# Patient Record
Sex: Female | Born: 1960 | Race: White | Hispanic: No | Smoking: Never smoker
Health system: Southern US, Community
[De-identification: ages and names within clinical notes are randomized; demographics above are authoritative.]

## PROBLEM LIST (undated history)

## (undated) DIAGNOSIS — F32A Depression, unspecified: Secondary | ICD-10-CM

## (undated) DIAGNOSIS — M1990 Unspecified osteoarthritis, unspecified site: Secondary | ICD-10-CM

## (undated) DIAGNOSIS — H269 Unspecified cataract: Secondary | ICD-10-CM

## (undated) DIAGNOSIS — T7840XA Allergy, unspecified, initial encounter: Secondary | ICD-10-CM

## (undated) DIAGNOSIS — F329 Major depressive disorder, single episode, unspecified: Secondary | ICD-10-CM

## (undated) DIAGNOSIS — R002 Palpitations: Secondary | ICD-10-CM

## (undated) HISTORY — DX: Depression, unspecified: F32.A

## (undated) HISTORY — PX: CHOLECYSTECTOMY: SHX55

## (undated) HISTORY — DX: Major depressive disorder, single episode, unspecified: F32.9

## (undated) HISTORY — PX: APPENDECTOMY: SHX54

## (undated) HISTORY — DX: Unspecified cataract: H26.9

## (undated) HISTORY — DX: Unspecified osteoarthritis, unspecified site: M19.90

## (undated) HISTORY — PX: LUMBAR SPINE SURGERY: SHX701

## (undated) HISTORY — DX: Palpitations: R00.2

## (undated) HISTORY — PX: ANKLE SURGERY: SHX546

## (undated) HISTORY — DX: Allergy, unspecified, initial encounter: T78.40XA

---

## 2004-05-25 ENCOUNTER — Ambulatory Visit: Payer: Self-pay | Admitting: Internal Medicine

## 2004-06-07 ENCOUNTER — Ambulatory Visit: Payer: Self-pay | Admitting: Internal Medicine

## 2004-10-03 ENCOUNTER — Ambulatory Visit: Payer: Self-pay | Admitting: Internal Medicine

## 2005-02-10 ENCOUNTER — Ambulatory Visit: Payer: Self-pay | Admitting: Internal Medicine

## 2005-05-23 ENCOUNTER — Ambulatory Visit: Payer: Self-pay | Admitting: Internal Medicine

## 2006-05-02 ENCOUNTER — Ambulatory Visit: Payer: Self-pay | Admitting: Internal Medicine

## 2006-05-04 ENCOUNTER — Ambulatory Visit (HOSPITAL_COMMUNITY): Admission: RE | Admit: 2006-05-04 | Discharge: 2006-05-04 | Payer: Self-pay | Admitting: Internal Medicine

## 2006-05-09 ENCOUNTER — Ambulatory Visit: Payer: Self-pay | Admitting: Internal Medicine

## 2006-05-10 ENCOUNTER — Encounter: Admission: RE | Admit: 2006-05-10 | Discharge: 2006-05-10 | Payer: Self-pay | Admitting: Internal Medicine

## 2006-05-16 ENCOUNTER — Ambulatory Visit: Payer: Self-pay | Admitting: Internal Medicine

## 2006-05-22 HISTORY — PX: ABDOMINAL HYSTERECTOMY: SHX81

## 2006-05-25 ENCOUNTER — Ambulatory Visit: Payer: Self-pay | Admitting: Internal Medicine

## 2006-06-14 ENCOUNTER — Encounter: Admission: RE | Admit: 2006-06-14 | Discharge: 2006-06-14 | Payer: Self-pay | Admitting: Orthopedic Surgery

## 2006-06-29 ENCOUNTER — Ambulatory Visit: Payer: Self-pay | Admitting: Internal Medicine

## 2006-07-25 ENCOUNTER — Encounter: Admission: RE | Admit: 2006-07-25 | Discharge: 2006-07-25 | Payer: Self-pay | Admitting: Neurology

## 2006-08-28 ENCOUNTER — Encounter: Admission: RE | Admit: 2006-08-28 | Discharge: 2006-08-28 | Payer: Self-pay | Admitting: Obstetrics and Gynecology

## 2006-09-19 ENCOUNTER — Encounter (INDEPENDENT_AMBULATORY_CARE_PROVIDER_SITE_OTHER): Payer: Self-pay | Admitting: Specialist

## 2006-09-19 ENCOUNTER — Ambulatory Visit (HOSPITAL_COMMUNITY): Admission: RE | Admit: 2006-09-19 | Discharge: 2006-09-20 | Payer: Self-pay | Admitting: *Deleted

## 2006-10-27 ENCOUNTER — Encounter: Admission: RE | Admit: 2006-10-27 | Discharge: 2006-10-27 | Payer: Self-pay | Admitting: Neurosurgery

## 2006-11-30 ENCOUNTER — Inpatient Hospital Stay (HOSPITAL_COMMUNITY): Admission: RE | Admit: 2006-11-30 | Discharge: 2006-12-04 | Payer: Self-pay | Admitting: Neurosurgery

## 2006-12-15 DIAGNOSIS — J309 Allergic rhinitis, unspecified: Secondary | ICD-10-CM | POA: Insufficient documentation

## 2006-12-15 DIAGNOSIS — J45909 Unspecified asthma, uncomplicated: Secondary | ICD-10-CM

## 2006-12-15 DIAGNOSIS — IMO0001 Reserved for inherently not codable concepts without codable children: Secondary | ICD-10-CM

## 2007-02-04 ENCOUNTER — Ambulatory Visit: Payer: Self-pay | Admitting: Internal Medicine

## 2007-02-20 ENCOUNTER — Ambulatory Visit: Payer: Self-pay | Admitting: Internal Medicine

## 2007-02-20 LAB — CONVERTED CEMR LAB
ALT: 23 units/L (ref 0–35)
AST: 25 units/L (ref 0–37)
Albumin: 4.5 g/dL (ref 3.5–5.2)
Amylase: 104 units/L (ref 27–131)
BUN: 6 mg/dL (ref 6–23)
Bilirubin Urine: NEGATIVE
Calcium: 9.6 mg/dL (ref 8.4–10.5)
Chloride: 108 meq/L (ref 96–112)
GFR calc non Af Amer: 115 mL/min
Glucose, Bld: 103 mg/dL — ABNORMAL HIGH (ref 70–99)
Ketones, ur: NEGATIVE mg/dL
RBC / HPF: NONE SEEN
Sodium: 145 meq/L (ref 135–145)
Specific Gravity, Urine: <=1.005 (ref 1.000–1.03)
Total Bilirubin: 0.8 mg/dL (ref 0.3–1.2)
Urobilinogen, UA: 0.2 (ref 0.0–1.0)

## 2007-02-22 ENCOUNTER — Encounter: Payer: Self-pay | Admitting: Internal Medicine

## 2007-03-02 ENCOUNTER — Encounter: Payer: Self-pay | Admitting: Internal Medicine

## 2007-04-26 ENCOUNTER — Ambulatory Visit: Payer: Self-pay | Admitting: Internal Medicine

## 2007-04-30 ENCOUNTER — Telehealth: Payer: Self-pay | Admitting: Internal Medicine

## 2007-05-01 ENCOUNTER — Ambulatory Visit: Payer: Self-pay | Admitting: Internal Medicine

## 2007-05-02 ENCOUNTER — Ambulatory Visit: Payer: Self-pay | Admitting: Internal Medicine

## 2007-05-09 ENCOUNTER — Telehealth: Payer: Self-pay | Admitting: Internal Medicine

## 2007-05-09 ENCOUNTER — Encounter: Payer: Self-pay | Admitting: Internal Medicine

## 2007-07-31 ENCOUNTER — Ambulatory Visit: Payer: Self-pay | Admitting: Internal Medicine

## 2007-10-01 ENCOUNTER — Encounter: Admission: RE | Admit: 2007-10-01 | Discharge: 2007-10-01 | Payer: Self-pay | Admitting: Internal Medicine

## 2007-10-24 ENCOUNTER — Ambulatory Visit: Payer: Self-pay | Admitting: Internal Medicine

## 2007-10-24 DIAGNOSIS — F4321 Adjustment disorder with depressed mood: Secondary | ICD-10-CM

## 2007-10-24 DIAGNOSIS — K1120 Sialoadenitis, unspecified: Secondary | ICD-10-CM

## 2007-11-12 ENCOUNTER — Ambulatory Visit: Payer: Self-pay | Admitting: Licensed Clinical Social Worker

## 2007-11-18 ENCOUNTER — Ambulatory Visit: Payer: Self-pay | Admitting: Licensed Clinical Social Worker

## 2007-11-25 ENCOUNTER — Ambulatory Visit: Payer: Self-pay | Admitting: Licensed Clinical Social Worker

## 2007-11-27 ENCOUNTER — Ambulatory Visit: Payer: Self-pay | Admitting: Internal Medicine

## 2007-11-27 ENCOUNTER — Telehealth: Payer: Self-pay | Admitting: Internal Medicine

## 2007-11-27 DIAGNOSIS — Z9189 Other specified personal risk factors, not elsewhere classified: Secondary | ICD-10-CM | POA: Insufficient documentation

## 2007-11-29 ENCOUNTER — Telehealth: Payer: Self-pay | Admitting: Internal Medicine

## 2007-12-02 ENCOUNTER — Ambulatory Visit: Payer: Self-pay | Admitting: Licensed Clinical Social Worker

## 2008-01-10 ENCOUNTER — Telehealth (INDEPENDENT_AMBULATORY_CARE_PROVIDER_SITE_OTHER): Payer: Self-pay | Admitting: *Deleted

## 2008-02-04 ENCOUNTER — Ambulatory Visit: Payer: Self-pay | Admitting: Internal Medicine

## 2008-02-04 LAB — CONVERTED CEMR LAB
FSH: 21.8 m[IU]/mL
LH: 9.9 m[IU]/mL

## 2008-02-06 ENCOUNTER — Encounter: Payer: Self-pay | Admitting: Internal Medicine

## 2008-04-09 IMAGING — CR DG LUMBAR SPINE COMPLETE 4+V
5 series · 5 of 5 positions shown · non-contrast
Comparison: none

CLINICAL DATA: Low back pain.  Pain radiates down right leg to knee.  Occasional popping in low back. 
 LUMBAR SPINE - 5 VIEW:

[t l-spine a.p.]
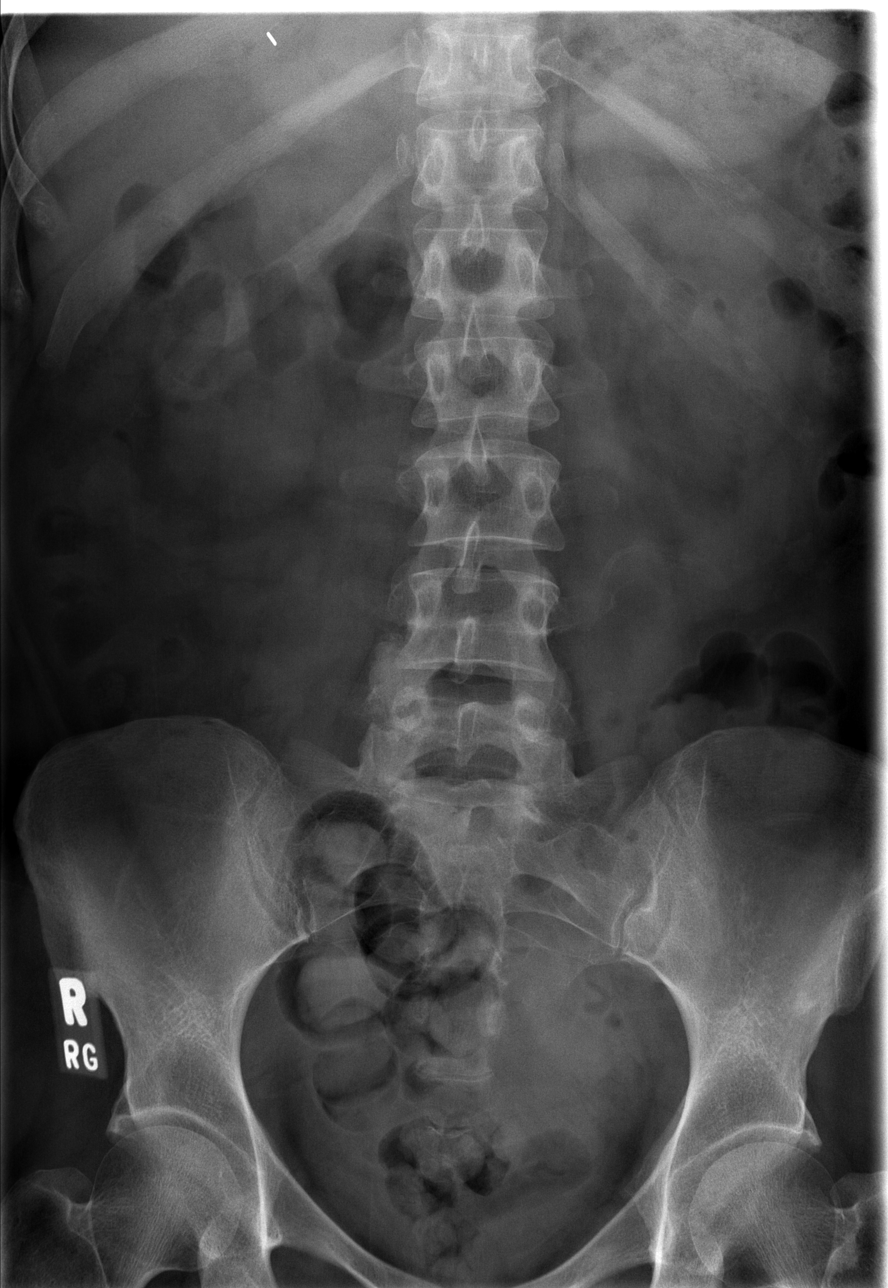

[t l-spine oblique exposure (1 of 2)]
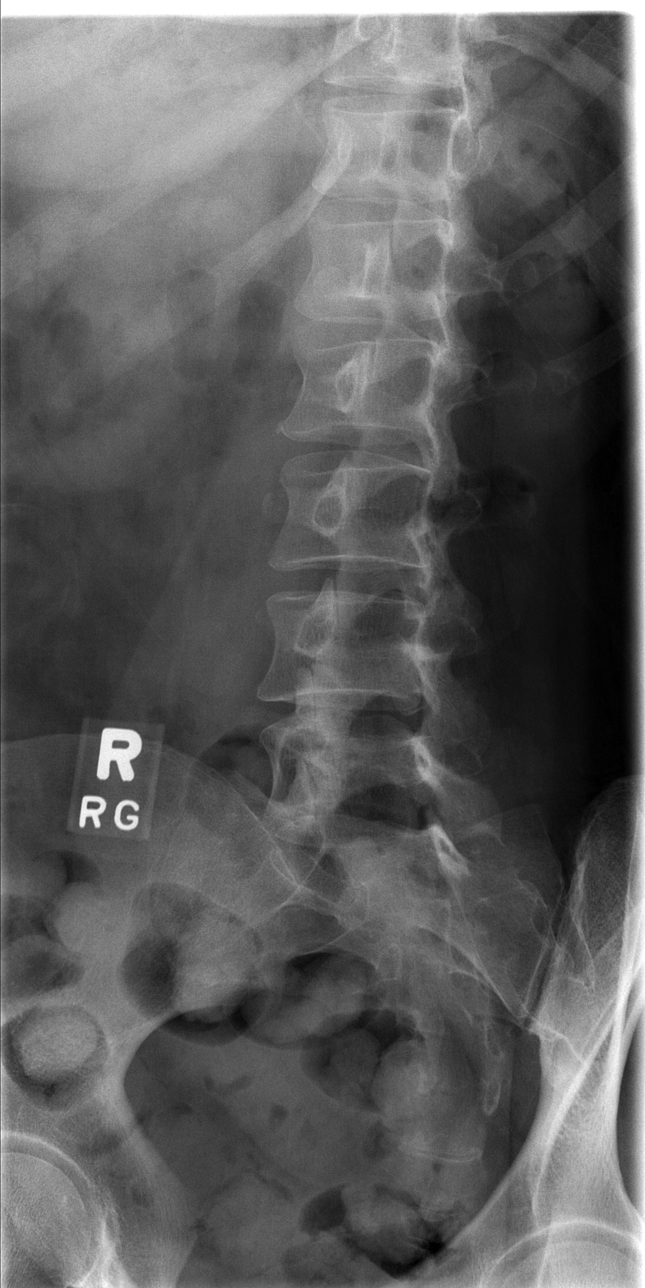

[t l-spine oblique exposure (2 of 2)]
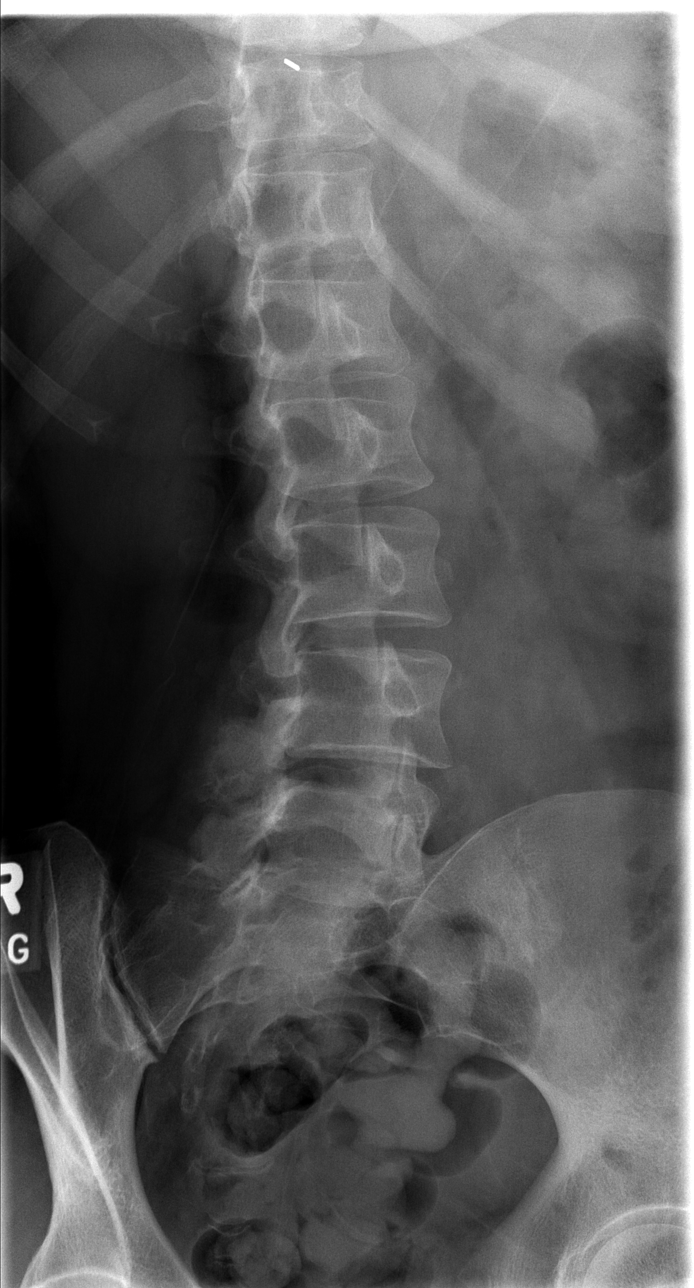

[t l-spine lat]
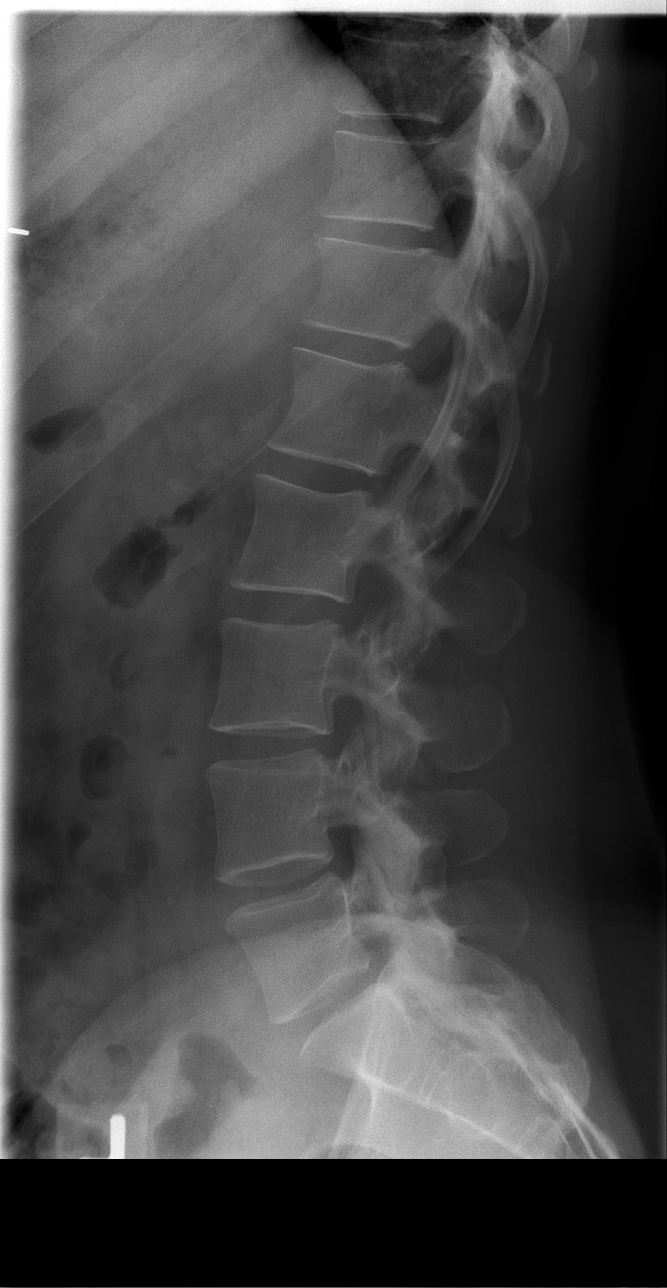

[t l-spine l5-s1 spot]
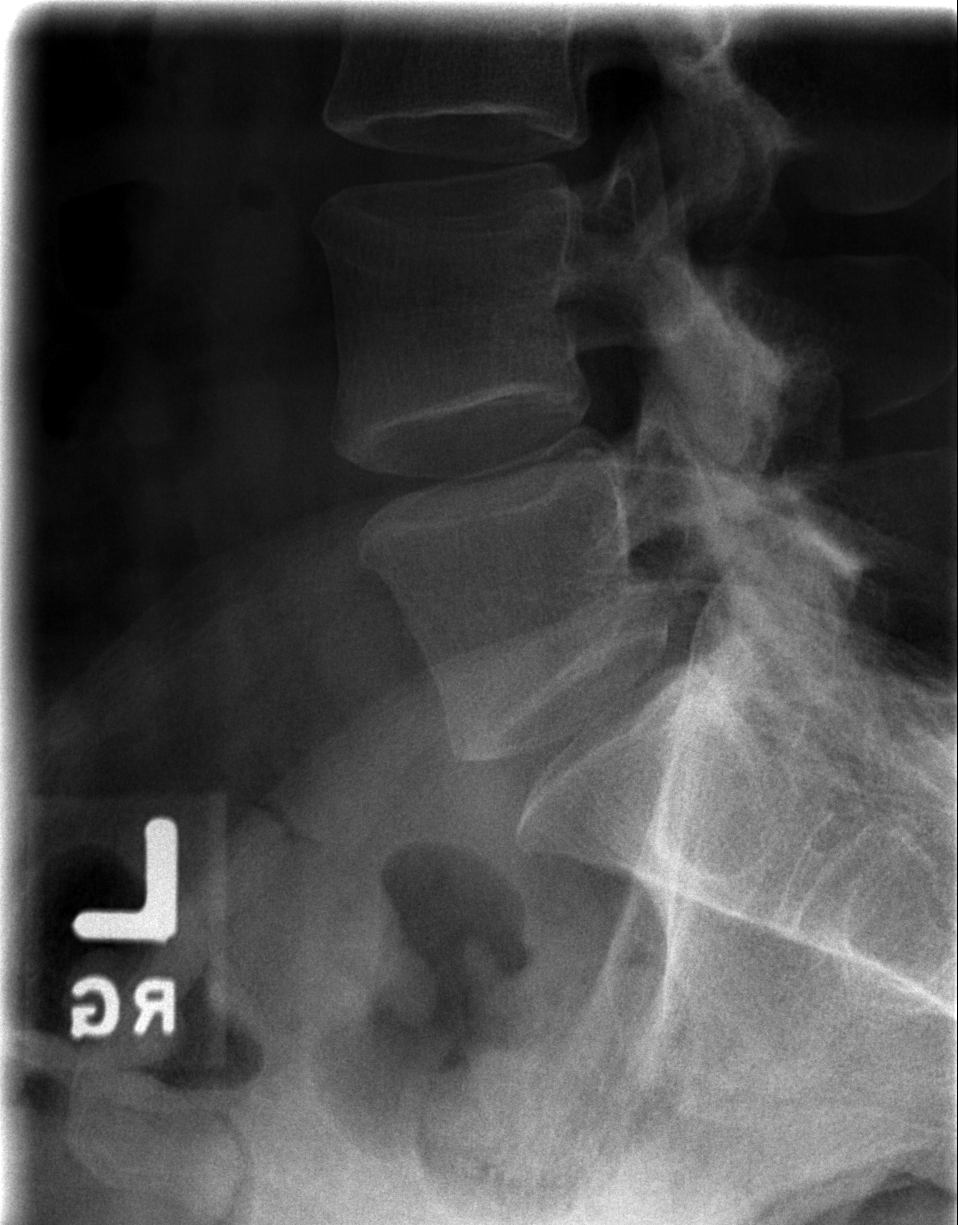

[5 of 5 positions shown; findings below may reference images not displayed]

FINDINGS: There is no evidence of lumbar spine fracture.  Alignment is normal.  Intervertebral disc spaces are maintained, and no other significant bone abnormalities are identified.  Lower lumbar degenerative facet joint changes, mainly on the right at L4-5.  SI joints unremarkable.
IMPRESSION: No acute lumbar spine abnormality.  Degenerative facet arthropathic changes on the right at L4-5.

## 2008-07-24 ENCOUNTER — Ambulatory Visit: Payer: Self-pay | Admitting: Internal Medicine

## 2008-07-28 ENCOUNTER — Telehealth (INDEPENDENT_AMBULATORY_CARE_PROVIDER_SITE_OTHER): Payer: Self-pay | Admitting: *Deleted

## 2008-07-29 ENCOUNTER — Encounter: Payer: Self-pay | Admitting: Internal Medicine

## 2008-08-05 ENCOUNTER — Telehealth: Payer: Self-pay | Admitting: Internal Medicine

## 2008-08-12 ENCOUNTER — Telehealth (INDEPENDENT_AMBULATORY_CARE_PROVIDER_SITE_OTHER): Payer: Self-pay | Admitting: *Deleted

## 2008-09-22 ENCOUNTER — Ambulatory Visit: Payer: Self-pay | Admitting: Internal Medicine

## 2008-10-15 ENCOUNTER — Ambulatory Visit: Payer: Self-pay | Admitting: Internal Medicine

## 2008-10-26 ENCOUNTER — Telehealth: Payer: Self-pay | Admitting: Endocrinology

## 2008-10-30 ENCOUNTER — Telehealth: Payer: Self-pay | Admitting: Endocrinology

## 2008-10-30 ENCOUNTER — Ambulatory Visit: Payer: Self-pay | Admitting: Endocrinology

## 2008-11-05 ENCOUNTER — Encounter: Payer: Self-pay | Admitting: Internal Medicine

## 2009-01-18 ENCOUNTER — Ambulatory Visit: Payer: Self-pay | Admitting: Internal Medicine

## 2009-04-09 ENCOUNTER — Ambulatory Visit: Payer: Self-pay | Admitting: Internal Medicine

## 2009-04-09 ENCOUNTER — Encounter (INDEPENDENT_AMBULATORY_CARE_PROVIDER_SITE_OTHER): Payer: Self-pay | Admitting: *Deleted

## 2009-06-04 ENCOUNTER — Ambulatory Visit: Payer: Self-pay | Admitting: Internal Medicine

## 2009-06-04 DIAGNOSIS — J069 Acute upper respiratory infection, unspecified: Secondary | ICD-10-CM | POA: Insufficient documentation

## 2009-06-15 ENCOUNTER — Telehealth: Payer: Self-pay | Admitting: Internal Medicine

## 2009-07-15 ENCOUNTER — Ambulatory Visit: Payer: Self-pay | Admitting: Internal Medicine

## 2009-08-03 ENCOUNTER — Telehealth: Payer: Self-pay | Admitting: Internal Medicine

## 2009-08-12 ENCOUNTER — Ambulatory Visit: Payer: Self-pay | Admitting: Internal Medicine

## 2009-08-12 LAB — CONVERTED CEMR LAB
Basophils Absolute: 0.1 10*3/uL (ref 0.0–0.1)
Calcium: 9.1 mg/dL (ref 8.4–10.5)
Eosinophils Absolute: 0.6 10*3/uL (ref 0.0–0.7)
Eosinophils Relative: 7.1 % — ABNORMAL HIGH (ref 0.0–5.0)
GFR calc non Af Amer: 94.76 mL/min (ref >60)
HCT: 38 % (ref 36.0–46.0)
Hemoglobin: 13.1 g/dL (ref 12.0–15.0)
Lymphs Abs: 2.5 10*3/uL (ref 0.7–4.0)
MCV: 87 fL (ref 78.0–100.0)
Monocytes Absolute: 1.2 10*3/uL — ABNORMAL HIGH (ref 0.1–1.0)
Monocytes Relative: 13.5 % — ABNORMAL HIGH (ref 3.0–12.0)
Platelets: 240 10*3/uL (ref 150.0–400.0)
Potassium: 4 meq/L (ref 3.5–5.1)
Sodium: 142 meq/L (ref 135–145)
TSH: 2.36 microintl units/mL (ref 0.35–5.50)
Vitamin B-12: 601 pg/mL (ref 211–911)
WBC: 8.7 10*3/uL (ref 4.5–10.5)

## 2009-08-19 ENCOUNTER — Ambulatory Visit: Payer: Self-pay | Admitting: Internal Medicine

## 2009-10-28 ENCOUNTER — Ambulatory Visit: Payer: Self-pay | Admitting: Internal Medicine

## 2009-11-08 ENCOUNTER — Encounter (INDEPENDENT_AMBULATORY_CARE_PROVIDER_SITE_OTHER): Payer: Self-pay | Admitting: *Deleted

## 2009-11-08 ENCOUNTER — Ambulatory Visit: Payer: Self-pay | Admitting: Internal Medicine

## 2009-11-19 ENCOUNTER — Telehealth: Payer: Self-pay | Admitting: Internal Medicine

## 2010-01-14 ENCOUNTER — Ambulatory Visit: Payer: Self-pay | Admitting: Internal Medicine

## 2010-02-16 ENCOUNTER — Telehealth: Payer: Self-pay | Admitting: Internal Medicine

## 2010-03-17 ENCOUNTER — Ambulatory Visit: Payer: Self-pay | Admitting: Internal Medicine

## 2010-03-17 DIAGNOSIS — R002 Palpitations: Secondary | ICD-10-CM

## 2010-03-17 DIAGNOSIS — M2550 Pain in unspecified joint: Secondary | ICD-10-CM

## 2010-03-17 HISTORY — DX: Palpitations: R00.2

## 2010-05-04 ENCOUNTER — Telehealth: Payer: Self-pay | Admitting: Internal Medicine

## 2010-06-13 ENCOUNTER — Encounter: Payer: Self-pay | Admitting: Internal Medicine

## 2010-06-21 NOTE — Letter (Signed)
Summary: Out of Work  [facility]  [address] [place], [place] [address]   Phone: [phone]  Fax: [phone]    November 08, 2009   Employee:  CYNITHIA HAKIMI    To Whom It May Concern:   For Medical reasons, please excuse the above named employee from work for the following dates:  Start: 11/08/09    End: 11/10/09, May return back to work on Wednesaday    If you need additional information, please feel free to contact our office.         Sincerely,    Dr. Rene Paci

## 2010-06-21 NOTE — Progress Notes (Signed)
Summary: OV & LABS  Phone Note Call from Patient Call back at Home Phone [phone] Call back at [facility] [phone]   Summary of Call: Patient is requesting office visit next week with labs prior. Pt c/o fatigue, trouble sleeping, decreased appetite x 3 wks and would like "everything" checked.  Initial call taken by: Lamar Sprinkles, CMA,  August 03, 2009 2:56 PM  Follow-up for Phone Call        1. OK for OV  2. Lab 780.79: CBCD, Metabolic, TSH, B12. She needs to be seen within 48 horus of having lab.  Follow-up by: Jacques Navy MD,  August 03, 2009 4:14 PM  Additional Follow-up for Phone Call Additional follow up Details #1::        Patient notified and transferred to schedule. Additional Follow-up by: Lucious Groves,  August 04, 2009 9:38 AM

## 2010-06-21 NOTE — Assessment & Plan Note (Signed)
Summary: PER KELLY--STC   Vital Signs:  Patient profile:   50 year old female Height:      63 inches (160.02 cm) Weight:      174.25 pounds BMI:     30.98 O2 Sat:      95 % on Room air Temp:     98.0 degrees F oral Pulse rate:   80 / minute BP sitting:   114 / 82  (left arm) Cuff size:   regular  Vitals Entered By: Brenton Grills (August 19, 2009 10:59 AM)  O2 Flow:  Room air CC: pt here for f/u after labs. Pt states she is fatigued, decreased appetite, trouble sleeping x 3 weeks/aj   Primary Care Provider:  Jacques Navy MD  CC:  pt here for f/u after labs. Pt states she is fatigued, decreased appetite, and trouble sleeping x 3 weeks/aj.  History of Present Illness: Patient well known to the practice. She had called complaining of excessive fatigue and malaise without specific complaint. She has had no fever/chils, weight changes, chest pain, breathing difficulty or other specific physical complaints. She did have a battery of lab prior to this visit.   Current Medications (verified): 1)  Proair Hfa 108 (90 Base) Mcg/act Aers (Albuterol Sulfate) .... 2 Puffs Four Times Per Day As Needed 2)  Sertraline Hcl 100 Mg  Tabs (Sertraline Hcl) .Marland Kitchen.. 1 By Mouth Once Daily 3)  Singulair 10 Mg Tabs (Montelukast Sodium) .Marland Kitchen.. 1 By Mouth Once Daily 4)  Nasacort Aq 55 Mcg/act Aers (Triamcinolone Acetonide(Nasal)) .Marland Kitchen.. 1 Spray Each Nostril Daily 5)  Vicodin 5-500 Mg Tabs (Hydrocodone-Acetaminophen) .Marland Kitchen.. 1 Q4h As Needed Pain  Allergies (verified): No Known Drug Allergies  Past History:  Past Medical History: Last updated: 04/26/2007 Allergic rhinitis Asthma Depression FMS  Past Surgical History: Last updated: 04/26/2007 Lumbar laminectomy - 7/08 Hysterectomy - 4/08 right ankle surg 1998 with plate and screw Appendectomy Cholecystectomy Tubal ligation Tonsillectomy PMH reviewed for relevance, PSH reviewed for relevance  Review of Systems  The patient denies anorexia, fever,  weight loss, weight gain, chest pain, dyspnea on exertion, headaches, abdominal pain, severe indigestion/heartburn, muscle weakness, difficulty walking, and depression.    Physical Exam  General:  Well-developed,well-nourished,in no acute distress; alert,appropriate and cooperative throughout examination Head:  normocephalic and atraumatic.   Eyes:  corneas and lenses clear and no injection.   Lungs:  Normal respiratory effort, chest expands symmetrically. Lungs are clear to auscultation, no crackles or wheezes. Heart:  normal rate and regular rhythm.   Abdomen:  soft and normal bowel sounds.   Msk:  normal ROM and no joint tenderness.   Pulses:  2+ radial Neurologic:  alert & oriented X3, cranial nerves II-XII intact, and gait normal.   Skin:  turgor normal and color normal.   Psych:  Oriented X3, memory intact for recent and remote, normally interactive, and good eye contact.     Impression & Recommendations:  Problem # 1:  OTHER MALAISE AND FATIGUE (ICD-780.79) Persistent malaise and fatigue. All labs reviewed and are normal. Question of possible sleep disorder, e.g. OSA  Plan  - schedule sleep study if approval can be obtained from insuror.  Complete Medication List: 1)  Proair Hfa 108 (90 Base) Mcg/act Aers (Albuterol sulfate) .... 2 puffs four times per day as needed 2)  Sertraline Hcl 100 Mg Tabs (Sertraline hcl) .Marland Kitchen.. 1 by mouth once daily 3)  Singulair 10 Mg Tabs (Montelukast sodium) .Marland Kitchen.. 1 by mouth once daily 4)  Nasacort  Aq 55 Mcg/act Aers (Triamcinolone acetonide(nasal)) .Marland Kitchen.. 1 spray each nostril daily 5)  Vicodin 5-500 Mg Tabs (Hydrocodone-acetaminophen) .Marland Kitchen.. 1 q4h as needed pain 6)  Temazepam 15 Mg Caps (Temazepam) .Marland Kitchen.. 1 by mouth at bedtime  Other Orders: Sleep Disorder Referral (Sleep Disorder) Prescriptions: TEMAZEPAM 15 MG CAPS (TEMAZEPAM) 1 by mouth at bedtime  #30 x 3   Entered and Authorized by:   Jacques Navy MD   Signed by:   Jacques Navy MD on  08/19/2009   Method used:   Print then Give to Patient   RxID:   [id number]

## 2010-06-21 NOTE — Progress Notes (Signed)
Summary: REQ FOR RX  Phone Note Call from Patient Call back at Home Phone [phone]   Summary of Call: Patient is requesting rx for diflucan for vaginal yeast infection Initial call taken by: Lamar Sprinkles, CMA,  June 15, 2009 9:35 AM  Follow-up for Phone Call        if we could document some symptoms please.  OK for fluconazole 150mg  x 1 for yeast infection.  Follow-up by: Jacques Navy MD,  June 15, 2009 9:56 AM  Additional Follow-up for Phone Call Additional follow up Details #1::        left mess to call office back .........................Marland KitchenLamar Sprinkles, CMA  June 15, 2009 10:29 AM     Additional Follow-up for Phone Call Additional follow up Details #2::    pt called back and has vag itch,discharge and wants prescription called into pharmacy. Follow-up by: Verdell Face,  June 16, 2009 10:55 AM  Additional Follow-up for Phone Call Additional follow up Details #3:: Details for Additional Follow-up Action Taken: Rx sent to [facility]  Additional Follow-up by: Jacques Navy MD,  June 16, 2009 1:21 PM  New/Updated Medications: FLUCONAZOLE 150 MG TABS (FLUCONAZOLE) take 1 tab Prescriptions: FLUCONAZOLE 150 MG TABS (FLUCONAZOLE) take 1 tab  #1 x 0   Entered by:   Ami Bullins CMA   Authorized by:   Jacques Navy MD   Signed by:   Bill Salinas CMA on 06/16/2009   Method used:   Electronically to        [facility]* (retail)       [address]       [place], [place]  [id number]       Ph: [phone]       Fax: [phone]   RxID:   [id number]  Left vm for pt to check with her pharm................Lamar Sprinkles CMA 06/17/09

## 2010-06-21 NOTE — Letter (Signed)
Summary: Out of Work  [facility]  [address] [place], [place] [address]   Phone: [phone]  Fax: [phone]    June 04, 2009   Employee:  NEILANI DUFFEE    To Whom It May Concern:   For Medical reasons, please excuse the above named employee from work for the following dates:  Start:   Friday, June 04, 2009  End:   Monday, June 07, 2009  If you need additional information, please feel free to contact our office.         Sincerely,    Jacques Navy MD

## 2010-06-21 NOTE — Progress Notes (Signed)
Summary: DIFLUCAN  Phone Note Call from Patient   Summary of Call: Pt feels that she took the diflucan too early and the otc monistat has not helped. Patient is requesting refill - advised her that original rx had refill.  Initial call taken by: Lamar Sprinkles, CMA,  February 16, 2010 10:34 AM

## 2010-06-21 NOTE — Assessment & Plan Note (Signed)
Summary: dizziness, and bump on neck-lb   Vital Signs:  Patient profile:   50 year old female Height:      63 inches Weight:      172 pounds BMI:     30.58 O2 Sat:      96 % on Room air Temp:     98.3 degrees F oral Pulse rate:   118 / minute BP sitting:   108 / 70  (left arm) Cuff size:   regular  Vitals Entered By: Bill Salinas CMA (March 17, 2010 1:59 PM)  O2 Flow:  Room air CC: pt here for evaluation of what she describes as a knot on the right side of her neck. Pt states she gets light headed feeling when she pushes in on this knot. She just noticed this knot today/ ab   Primary Care Provider:  Jacques Navy MD  CC:  pt here for evaluation of what she describes as a knot on the right side of her neck. Pt states she gets light headed feeling when she pushes in on this knot. She just noticed this knot today/ ab.  History of Present Illness: Ms. Fofana presents today with a new discovery of a lump she found above her right clavical.  She states that for the past couple days she has been having feelings of a "flutter" in her throat.  She states that the flutter resolved after a few days, came back yesterday and then resolved last night.  It wasn't until this morning that she incidentally found the knot on her neck.  She states that she did feel light headed when she first pressed it but now only feels an achy pain when she applies pressure.  She denies any recent upper respiratory tract infections, cough, fevers/chills, or any other noticable nodules.  She did have a sinus infection in August for which she completed her entire course of antibiotics.  She denies any recent weight logg or gain, any changes in her temperature, and GI dysfunction.  She states she has noticed that her appetite has been supressed some what over the past 2 days.      Current Medications (verified): 1)  Proair Hfa 108 (90 Base) Mcg/act Aers (Albuterol Sulfate) .... 2 Puffs Four Times Per Day As Needed 2)   Bupropion Hcl 150 Mg Xr24h-Tab (Bupropion Hcl) .Marland Kitchen.. 1 By Mouth Qam 3)  Singulair 10 Mg Tabs (Montelukast Sodium) .Marland Kitchen.. 1 By Mouth Once Daily 4)  Vicodin 5-500 Mg Tabs (Hydrocodone-Acetaminophen) .Marland Kitchen.. 1 Q4h As Needed Pain 5)  Temazepam 15 Mg Caps (Temazepam) .Marland Kitchen.. 1 By Mouth At Bedtime 6)  Herbal Suppl. Tumeric, Boswellia & Luten .... Daily 7)  Multivitamins  Tabs (Multiple Vitamin) .Marland Kitchen.. 1 Once Daily 8)  Vitamin C Cr 1000 Mg Cr-Tabs (Ascorbic Acid) .Marland Kitchen.. 1 Tab Daily  Allergies (verified): No Known Drug Allergies  Past History:  Past Medical History: Last updated: 11/08/2009 Allergic rhinitis Asthma Depression FMS  Past Surgical History: Last updated: 04/26/2007 Lumbar laminectomy - 7/08 Hysterectomy - 4/08 right ankle surg 1998 with plate and screw Appendectomy Cholecystectomy Tubal ligation Tonsillectomy  Family History: Last updated: 01/14/2010 mother- 1943: with fibromyalgia, breast cancer, HTN, DM Father - deceased @ 41: CAD/MI,  Neg- colon cancer GM, GF with heart problems.  GF- deceased pancreatic cancer  Social History: Last updated: 01/14/2010 HSG, 2 years college; [employer] Married - '86- 5 years/divorced; sexually active - uses barrier protection at all times. 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  Former Smoker Alcohol use-no  Risk Factors: Smoking Status: never (09/22/2008)  Review of Systems       The patient complains of anorexia.  The patient denies fever, weight loss, weight gain, vision loss, decreased hearing, chest pain, syncope, dyspnea on exertion, prolonged cough, headaches, hemoptysis, melena, hematochezia, incontinence, muscle weakness, unusual weight change, abnormal bleeding, and enlarged lymph nodes.    Physical Exam  General:  alert, well-developed, and well-nourished.   Head:  normocephalic and atraumatic.   Eyes:  C&S clear Mouth:  pharynx pink and moist.   Neck:   supple, full ROM, no thyromegaly, no thyroid nodules or tenderness, no cervical lymphadenopathy, and no neck tenderness.   Chest Wall:  no deformities and chest wall tenderness.  Tenderness to palpation at the right clavicular-manubrium joint.   Lungs:  normal respiratory effort, normal breath sounds, no crackles, and no wheezes.   Heart:  normal rate, regular rhythm, no murmur, no gallop, and no rub.   Pulses:  R radial normal, R posterior tibial normal, L radial normal, and L posterior tibial normal.   Neurologic:  alert & oriented X3 and cranial nerves II-XII intact.   Cervical Nodes:  no anterior cervical adenopathy and no posterior cervical adenopathy.     Impression & Recommendations:  Problem # 1:  PAIN IN JOINT OTHER SPECIFIED SITES (ICD-719.48) On physical exam palpation over the location of the knot was very tender and worsened with motion of ther shoulders nad arms.  The mass she was feeling is most likely just inflammation around the joint of the clavical and manubrium.  Plan:  OTC Aleve to decrease inflammation           Aspercreme or Capsaicin Cream to decrease inflammation  Problem # 2:  PALPITATIONS (ICD-785.1) The occasional flutter that the patient was feeling was most likely due to an irregular rhythm in her heart, either PVCs or PSVT, not secondary to the inflammation in her joint.  Because she is asymptomatic at the time the irregular rhythm is only something to be aware of and monitored.  Plan:  She was informed how to perform a valsalva maneuver if the pain shoudl return.           If valsalva does not resolve the flutter then carrotid massage should be tried.           She will go the the ER if she should experience feelings of lightheadedness or SOB.    Complete Medication List: 1)  Proair Hfa 108 (90 Base) Mcg/act Aers (Albuterol sulfate) .... 2 puffs four times per day as needed 2)  Bupropion Hcl 150 Mg Xr24h-tab (Bupropion hcl) .Marland Kitchen.. 1 by mouth qam 3)  Singulair  10 Mg Tabs (Montelukast sodium) .Marland Kitchen.. 1 by mouth once daily 4)  Vicodin 5-500 Mg Tabs (Hydrocodone-acetaminophen) .Marland Kitchen.. 1 q4h as needed pain 5)  Temazepam 15 Mg Caps (Temazepam) .Marland Kitchen.. 1 by mouth at bedtime 6)  Herbal Suppl. Tumeric, Boswellia & Luten  .... Daily 7)  Multivitamins Tabs (Multiple vitamin) .Marland Kitchen.. 1 once daily 8)  Vitamin C Cr 1000 Mg Cr-tabs (Ascorbic acid) .Marland Kitchen.. 1 tab daily   Orders Added: 1)  Est. Patient Level III [40981]

## 2010-06-21 NOTE — Assessment & Plan Note (Signed)
Summary: change zoloft/cd   Vital Signs:  Patient profile:   50 year old female Weight:      172 pounds BMI:     30.58 Temp:     98.4 degrees F Pulse rate:   60 / minute BP sitting:   100 / 58  (left arm)  Vitals Entered By: Lamar Sprinkles, CMA (October 28, 2009 5:07 PM) CC: Discuss zoloft / SD Comments Does not take nasacort    Primary Care Provider:  Jacques Navy MD  CC:  Discuss zoloft / SD.  History of Present Illness: Patient feels that the zoloft is not effectively treating her depression. She did have to put a dog down. Work is terribly stressfull. She did not excpect to be at her job for as long as she has been. She is more emotional and tearful and depressed. She c/o low motivational state. She is not getting any professional counselling. She has thought of journaling. She is not suicidal.  Allergies (verified): No Known Drug Allergies  Past History:  Past Medical History: Last updated: 04/26/2007 Allergic rhinitis Asthma Depression FMS  Past Surgical History: Last updated: 04/26/2007 Lumbar laminectomy - 7/08 Hysterectomy - 4/08 right ankle surg 1998 with plate and screw Appendectomy Cholecystectomy Tubal ligation Tonsillectomy  Family History: Last updated: 04/26/2007 mother with fibromyalgia, breast cancer, HTN, DM  Social History: Last updated: 04/26/2007 Former Smoker Alcohol use-no  Risk Factors: Smoking Status: never (09/22/2008)  Review of Systems       The patient complains of depression.  The patient denies anorexia, fever, weight loss, weight gain, chest pain, syncope, dyspnea on exertion, prolonged cough, hemoptysis, abdominal pain, severe indigestion/heartburn, muscle weakness, suspicious skin lesions, difficulty walking, and angioedema.    Physical Exam  General:  Well-developed,well-nourished,in no acute distress; alert,appropriate and cooperative throughout examination Head:  Normocephalic and atraumatic without obvious  abnormalities. No apparent alopecia or balding. Eyes:  No corneal or conjunctival inflammation noted. EOMI. Perrla. Funduscopic exam benign, without hemorrhages, exudates or papilledema. Vision grossly normal. Neurologic:  alert & oriented X3, cranial nerves II-XII intact, strength normal in all extremities, sensation intact to pinprick, gait normal, and DTRs symmetrical and normal.   Skin:  turgor normal and color normal.   Psych:  Oriented X3 and memory intact for recent and remote.  Depressed affect, tearful.   Impression & Recommendations:  Problem # 1:  DEPRESSION, SITUATIONAL (ICD-309.0) more depressed eespite zoloft 100mg  daily. Motivation is a problem.  Plan - chnage to wellbutrin xl 150 q AM          rfer to behavioral medicine.   Complete Medication List: 1)  Proair Hfa 108 (90 Base) Mcg/act Aers (Albuterol sulfate) .... 2 puffs four times per day as needed 2)  Bupropion Hcl 150 Mg Xr24h-tab (Bupropion hcl) .Marland Kitchen.. 1 by mouth qam 3)  Singulair 10 Mg Tabs (Montelukast sodium) .Marland Kitchen.. 1 by mouth once daily 4)  Nasacort Aq 55 Mcg/act Aers (Triamcinolone acetonide(nasal)) .Marland Kitchen.. 1 spray each nostril daily 5)  Vicodin 5-500 Mg Tabs (Hydrocodone-acetaminophen) .Marland Kitchen.. 1 q4h as needed pain 6)  Temazepam 15 Mg Caps (Temazepam) .Marland Kitchen.. 1 by mouth at bedtime 7)  Herbal Suppl. Tumeric, Boswellia & Luten  .... Daily 8)  Multivitamins Tabs (Multiple vitamin) .Marland Kitchen.. 1 once daily Prescriptions: BUPROPION HCL 150 MG XR24H-TAB (BUPROPION HCL) 1 by mouth qAM  #30 x 12   Entered and Authorized by:   Jacques Navy MD   Signed by:   Jacques Navy MD on 10/28/2009  Method used:   Electronically to        [facility]* (retail)       [address]       [place], [place]  [id number]       Ph: [phone]       Fax: [phone]   RxID:   [id number]

## 2010-06-21 NOTE — Progress Notes (Signed)
  Phone Note Refill Request Message from:  Fax from Pharmacy on November 19, 2009 9:50 AM  Refills Requested: Medication #1:  SINGULAIR 10 MG TABS 1 by mouth once daily   Supply Requested: 1 month   Last Refilled: 09/2008 Initial call taken by: Rock Nephew CMA,  November 19, 2009 9:50 AM    Prescriptions: SINGULAIR 10 MG TABS (MONTELUKAST SODIUM) 1 by mouth once daily  #30 x prn   Entered by:   Rock Nephew CMA   Authorized by:   Jacques Navy MD   Signed by:   Rock Nephew CMA on 11/19/2009   Method used:   Electronically to        [facility]* (retail)       [address]       [place], [place]  [id number]       Ph: [phone]       Fax: [phone]   RxID:   [id number]

## 2010-06-21 NOTE — Assessment & Plan Note (Signed)
Summary: pt has a knot on her left foot-lb   Vital Signs:  Patient profile:   50 year old female Height:      63 inches Weight:      175.50 pounds BMI:     31.20 O2 Sat:      96 % on Room air Temp:     98.8 degrees F oral Pulse rate:   91 / minute BP sitting:   118 / 78  (left arm) Cuff size:   regular  Vitals Entered By: Brenton Grills (July 15, 2009 4:01 PM)  O2 Flow:  Room air CC: Pt has knot on left foot. Pt states that it has not been painful and there has not been any redness./aj   Primary Care Provider:  Jacques Navy MD  CC:  Pt has knot on left foot. Pt states that it has not been painful and there has not been any redness./aj.  History of Present Illness: patient presents due to a small nodule on the left foot which is a new finding. she recalls no injury. The nodules is not painful. She has not had any trouble with gait or balance.   Current Medications (verified): 1)  Proair Hfa 108 (90 Base) Mcg/act Aers (Albuterol Sulfate) .... 2 Puffs Four Times Per Day As Needed 2)  Sertraline Hcl 100 Mg  Tabs (Sertraline Hcl) .Marland Kitchen.. 1 By Mouth Once Daily 3)  Singulair 10 Mg Tabs (Montelukast Sodium) .Marland Kitchen.. 1 By Mouth Once Daily 4)  Nasacort Aq 55 Mcg/act Aers (Triamcinolone Acetonide(Nasal)) .Marland Kitchen.. 1 Spray Each Nostril Daily 5)  Vicodin 5-500 Mg Tabs (Hydrocodone-Acetaminophen) .Marland Kitchen.. 1 Q4h As Needed Pain 6)  Ery-Tab 333 Mg Tbec (Erythromycin Base) .Marland Kitchen.. 1 By Mouth Three Times A Day X 7 Days For Uri 7)  Promethazine-Codeine 6.25-10 Mg/59ml Syrp (Promethazine-Codeine) .Marland Kitchen.. 1 Tsp At Bedtime 8)  Fluconazole 150 Mg Tabs (Fluconazole) .... Take 1 Tab  Allergies (verified): No Known Drug Allergies PMH-FH-SH reviewed-no changes except otherwise noted  Review of Systems  The patient denies anorexia, weight loss, peripheral edema, muscle weakness, and difficulty walking.    Physical Exam  General:  Well-developed,well-nourished,in no acute distress; alert,appropriate and  cooperative throughout examination Msk:  left foot with a small nodule to the lateral aspect of the mid-foot dorsal aspect. The nodule is not hard and does seem to reduce with pressure. Foot without deformity.    Impression & Recommendations:  Problem # 1:  OTHER SYMPTOMS REFERABLE TO ANKLE AND FOOT JOINT (ICD-719.67) Small non-tender nodule left foot. Possibly a thrombosed vein for fatty tumor. It is too small to aspirate and not symptomatic to warrant biopsy.  Plan - watchfull waiting.  Complete Medication List: 1)  Proair Hfa 108 (90 Base) Mcg/act Aers (Albuterol sulfate) .... 2 puffs four times per day as needed 2)  Sertraline Hcl 100 Mg Tabs (Sertraline hcl) .Marland Kitchen.. 1 by mouth once daily 3)  Singulair 10 Mg Tabs (Montelukast sodium) .Marland Kitchen.. 1 by mouth once daily 4)  Nasacort Aq 55 Mcg/act Aers (Triamcinolone acetonide(nasal)) .Marland Kitchen.. 1 spray each nostril daily 5)  Vicodin 5-500 Mg Tabs (Hydrocodone-acetaminophen) .Marland Kitchen.. 1 q4h as needed pain 6)  Ery-tab 333 Mg Tbec (Erythromycin base) .Marland Kitchen.. 1 by mouth three times a day x 7 days for uri 7)  Promethazine-codeine 6.25-10 Mg/62ml Syrp (Promethazine-codeine) .Marland Kitchen.. 1 tsp at bedtime 8)  Fluconazole 150 Mg Tabs (Fluconazole) .... Take 1 tab

## 2010-06-21 NOTE — Assessment & Plan Note (Signed)
Summary: sore throat/cd   Vital Signs:  Patient profile:   50 year old female Height:      63 inches Weight:      171 pounds BMI:     30.40 O2 Sat:      96 % on Room air Temp:     98.1 degrees F oral Pulse rate:   81 / minute BP sitting:   110 / 72  (left arm) Cuff size:   regular  Vitals Entered By: Bill Salinas CMA (June 04, 2009 9:34 AM)  O2 Flow:  Room air CC: pt here with complaint of sinus pressure and congestion with drainage. She also c/o sore throat with productive cough (producing yellow mucous) x 3 days/ ab   Primary Care Provider:  Jacques Navy MD  CC:  pt here with complaint of sinus pressure and congestion with drainage. She also c/o sore throat with productive cough (producing yellow mucous) x 3 days/ ab.  History of Present Illness: Sick since tuesday with a sore throat, no fever. She has subsequently developed congestion, decreased hearing, mild otalgia, cough productive of scant sputum, feeling short of breath usually after the cough. She has been taking cough drops, vitamiin C.   Current Medications (verified): 1)  Proair Hfa 108 (90 Base) Mcg/act Aers (Albuterol Sulfate) .... 2 Puffs Four Times Per Day As Needed 2)  Sertraline Hcl 100 Mg  Tabs (Sertraline Hcl) .Marland Kitchen.. 1 By Mouth Once Daily 3)  Singulair 10 Mg Tabs (Montelukast Sodium) .Marland Kitchen.. 1 By Mouth Once Daily 4)  Nasacort Aq 55 Mcg/act Aers (Triamcinolone Acetonide(Nasal)) .Marland Kitchen.. 1 Spray Each Nostril Daily 5)  Vicodin 5-500 Mg Tabs (Hydrocodone-Acetaminophen) .Marland Kitchen.. 1 Q4h As Needed Pain  Allergies (verified): No Known Drug Allergies  Past History:  Past Medical History: Last updated: 04/26/2007 Allergic rhinitis Asthma Depression FMS  Past Surgical History: Last updated: 04/26/2007 Lumbar laminectomy - 7/08 Hysterectomy - 4/08 right ankle surg 1998 with plate and screw Appendectomy Cholecystectomy Tubal ligation Tonsillectomy  Family History: Last updated: 04/26/2007 mother with  fibromyalgia, breast cancer, HTN, DM  Social History: Last updated: 04/26/2007 Former Smoker Alcohol use-no  Risk Factors: Smoking Status: never (09/22/2008)  Review of Systems       The patient complains of decreased hearing, hoarseness, chest pain, dyspnea on exertion, and prolonged cough.  The patient denies anorexia, fever, abdominal pain, severe indigestion/heartburn, incontinence, muscle weakness, difficulty walking, unusual weight change, enlarged lymph nodes, and breast masses.    Physical Exam  General:  Well-developed,well-nourished,in no acute distress; alert,appropriate and cooperative throughout examination Head:  tender to percussion over the frontal and maxillary sinus Eyes:  No corneal or conjunctival inflammation noted. EOMI. Perrla. Funduscopic exam benign, without hemorrhages, exudates or papilledema. Vision grossly normal. Ears:  EACs and TMs normal Mouth:  throat clear Neck:  supple and full ROM.   Lungs:  normal respiratory effort, normal breath sounds, no crackles, and no wheezes.   Heart:  normal rate and regular rhythm.   Abdomen:  soft and normal bowel sounds.   Msk:  normal ROM, no joint tenderness, no joint swelling, and no joint warmth.   Neurologic:  alert & oriented X3 and cranial nerves II-XII intact.   Skin:  turgor normal, color normal, and no rashes.   Cervical Nodes:  no anterior cervical adenopathy and no posterior cervical adenopathy.   Psych:  Oriented X3, memory intact for recent and remote, and normally interactive.     Impression & Recommendations:  Problem # 1:  URI (ICD-465.9) URI starting as sore throat and now with cough, eustachian tube dysfunction.  Plan Ery-tab 333 three times a day x 7         supportive care - see pt instr         Prom/cod 1 tsp at bedtime  The following medications were removed from the medication list:    Mobic 15 Mg Tabs (Meloxicam) .Marland Kitchen... 1 by mouth once daily as needed for pain Her updated medication  list for this problem includes:    Promethazine-codeine 6.25-10 Mg/34ml Syrp (Promethazine-codeine) .Marland Kitchen... 1 tsp at bedtime  Complete Medication List: 1)  Proair Hfa 108 (90 Base) Mcg/act Aers (Albuterol sulfate) .... 2 puffs four times per day as needed 2)  Sertraline Hcl 100 Mg Tabs (Sertraline hcl) .Marland Kitchen.. 1 by mouth once daily 3)  Singulair 10 Mg Tabs (Montelukast sodium) .Marland Kitchen.. 1 by mouth once daily 4)  Nasacort Aq 55 Mcg/act Aers (Triamcinolone acetonide(nasal)) .Marland Kitchen.. 1 spray each nostril daily 5)  Vicodin 5-500 Mg Tabs (Hydrocodone-acetaminophen) .Marland Kitchen.. 1 q4h as needed pain 6)  Ery-tab 333 Mg Tbec (Erythromycin base) .Marland Kitchen.. 1 by mouth three times a day x 7 days for uri 7)  Promethazine-codeine 6.25-10 Mg/42ml Syrp (Promethazine-codeine) .Marland Kitchen.. 1 tsp at bedtime  Patient Instructions: 1)  Upper respiratory infectio - no sign of pneumonia. Plan - Ery-tab 333mg  three times a day for 7 days; generic robitussin DM 1 tsp q6 hrs during the day; promethazine with codeine 1 or 2 tsp at bedtime; pseudoephedrine 30mg  three times a day for congestion; hydrate; vit C 1500mg  daily; tylenol (generic) for fever or aches. REST Prescriptions: PROMETHAZINE-CODEINE 6.25-10 MG/5ML SYRP (PROMETHAZINE-CODEINE) 1 tsp at bedtime  #4 oz x 1   Entered and Authorized by:   Jacques Navy MD   Signed by:   Jacques Navy MD on 06/04/2009   Method used:   Print then Give to Patient   RxID:   [id number] ERY-TAB 333 MG TBEC (ERYTHROMYCIN BASE) 1 by mouth three times a day x 7 days for URI  #21 x 0   Entered and Authorized by:   Jacques Navy MD   Signed by:   Jacques Navy MD on 06/04/2009   Method used:   Electronically to        [facility]* (retail)       [address]       [place], [place]  [id number]       Ph: [phone]       Fax: [phone]   RxID:   [id number]

## 2010-06-21 NOTE — Assessment & Plan Note (Signed)
Summary: ?sinus inf/cd   Vital Signs:  Patient profile:   50 year old female Height:      63 inches Weight:      168 pounds BMI:     29.87 O2 Sat:      96 % on Room air Temp:     98.7 degrees F oral Pulse rate:   89 / minute BP sitting:   100 / 60  (left arm) Cuff size:   regular  Vitals Entered By: Bill Salinas CMA (January 14, 2010 3:08 PM)  O2 Flow:  Room air CC: pt here with c/o sinus pressure, runny nose, drainage and coughing x 3 weeks/ ab   Primary Care Provider:  Jacques Navy MD  CC:  pt here with c/o sinus pressure, runny nose, and drainage and coughing x 3 weeks/ ab.  History of Present Illness: Presents for persistent URI symptoms that have not responded to OTC allergy treatment. She is now having purulent sinus drainage and sinus pain. she is also developing a cough. No documented fever or chills. NO change in bowel habit. NO SOB.  Current Medications (verified): 1)  Proair Hfa 108 (90 Base) Mcg/act Aers (Albuterol Sulfate) .... 2 Puffs Four Times Per Day As Needed 2)  Bupropion Hcl 150 Mg Xr24h-Tab (Bupropion Hcl) .Marland Kitchen.. 1 By Mouth Qam 3)  Singulair 10 Mg Tabs (Montelukast Sodium) .Marland Kitchen.. 1 By Mouth Once Daily 4)  Vicodin 5-500 Mg Tabs (Hydrocodone-Acetaminophen) .Marland Kitchen.. 1 Q4h As Needed Pain 5)  Temazepam 15 Mg Caps (Temazepam) .Marland Kitchen.. 1 By Mouth At Bedtime 6)  Herbal Suppl. Tumeric, Boswellia & Luten .... Daily 7)  Multivitamins  Tabs (Multiple Vitamin) .Marland Kitchen.. 1 Once Daily 8)  Vitamin C Cr 1000 Mg Cr-Tabs (Ascorbic Acid) .Marland Kitchen.. 1 Tab Daily  Allergies (verified): No Known Drug Allergies  Past History:  Past Medical History: Last updated: 11/08/2009 Allergic rhinitis Asthma Depression FMS  Past Surgical History: Last updated: 04/26/2007 Lumbar laminectomy - 7/08 Hysterectomy - 4/08 right ankle surg 1998 with plate and screw Appendectomy Cholecystectomy Tubal ligation Tonsillectomy  Family History: mother- 39: with fibromyalgia, breast cancer, HTN,  DM Father - deceased @ 10: CAD/MI,  Neg- colon cancer GM, GF with heart problems.  GF- deceased pancreatic cancer  Social History: HSG, 2 years college; [employer] Married - '86- 5 years/divorced; sexually active - uses barrier protection at all times. 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  Former Smoker Alcohol use-no  Review of Systems       The patient complains of hoarseness.  The patient denies anorexia, fever, weight loss, weight gain, chest pain, syncope, dyspnea on exertion, headaches, severe indigestion/heartburn, muscle weakness, unusual weight change, and enlarged lymph nodes.    Physical Exam  General:  WNWD white female in no acute distress Head:  tender to percussion over maxillary sinuses Eyes:  C&S clear Mouth:  throat clear Neck:  supple.   Chest Wall:  No deformities, masses, or tenderness noted. Lungs:  normal respiratory effort, normal breath sounds, no crackles, and no wheezes.   Heart:  normal rate and regular rhythm.   Pulses:  2+ radial Neurologic:  alert & oriented X3 and gait normal.   Skin:  turgor normal and color normal.   Cervical Nodes:  no anterior cervical adenopathy and no posterior cervical adenopathy.   Psych:  Oriented X3, memory intact for recent and remote, normally interactive, and good eye contact.  Impression & Recommendations:  Problem # 1:  URI (ICD-465.9) Persistent symptoms of URI  Plan - sudafed 30mg  two times a day or three times a day           otc anti-tussive of choice           Augmentin 875 mi two times a day x 7  Complete Medication List: 1)  Proair Hfa 108 (90 Base) Mcg/act Aers (Albuterol sulfate) .... 2 puffs four times per day as needed 2)  Bupropion Hcl 150 Mg Xr24h-tab (Bupropion hcl) .Marland Kitchen.. 1 by mouth qam 3)  Singulair 10 Mg Tabs (Montelukast sodium) .Marland Kitchen.. 1 by mouth once daily 4)  Vicodin 5-500 Mg Tabs (Hydrocodone-acetaminophen) .Marland Kitchen.. 1 q4h as  needed pain 5)  Temazepam 15 Mg Caps (Temazepam) .Marland Kitchen.. 1 by mouth at bedtime 6)  Herbal Suppl. Tumeric, Boswellia & Luten  .... Daily 7)  Multivitamins Tabs (Multiple vitamin) .Marland Kitchen.. 1 once daily 8)  Vitamin C Cr 1000 Mg Cr-tabs (Ascorbic acid) .Marland Kitchen.. 1 tab daily 9)  Amoxicillin-pot Clavulanate 875-125 Mg Tabs (Amoxicillin-pot clavulanate) .Marland Kitchen.. 1 by mouth two times a day x 7 days for uri 10)  Fluconazole 150 Mg Tabs (Fluconazole) .Marland Kitchen.. 1 by mouth as needed for antibiotic related vaginitis. Prescriptions: FLUCONAZOLE 150 MG TABS (FLUCONAZOLE) 1 by mouth as needed for antibiotic related vaginitis.  #1 x 1   Entered and Authorized by:   Jacques Navy MD   Signed by:   Jacques Navy MD on 01/14/2010   Method used:   Electronically to        [facility]* (retail)       [address]       [place], [place]  [address]       Ph: [phone]       Fax: [phone]   RxID:   [id number] AMOXICILLIN-POT CLAVULANATE 875-125 MG TABS (AMOXICILLIN-POT CLAVULANATE) 1 by mouth two times a day x 7 days for URI  #14 x 0   Entered and Authorized by:   Jacques Navy MD   Signed by:   Jacques Navy MD on 01/14/2010   Method used:   Electronically to        [facility]* (retail)       [address]       [place], [place]  [id number]       Ph: [phone]       Fax: [phone]   RxID:   [id number]

## 2010-06-21 NOTE — Assessment & Plan Note (Signed)
Summary: ears plugged up/feeling worse/men/cd   Vital Signs:  Patient profile:   50 year old female Height:      63 inches (160.02 cm) Weight:      172 pounds (78.18 kg) O2 Sat:      98 % on Room air Temp:     98.7 degrees F (37.06 degrees C) oral Pulse rate:   74 / minute BP sitting:   100 / 62  (left arm) Cuff size:   regular  Vitals Entered By: Orlan Leavens (November 08, 2009 2:07 PM)  O2 Flow:  Room air CC: Nasal congestion, Ears popping ? fluid. also having some dizziness & lighthead, URI symptoms Is Patient Diabetic? No Pain Assessment Patient in pain? no        Primary Care Provider:  Jacques Navy MD  CC:  Nasal congestion, Ears popping ? fluid. also having some dizziness & lighthead, and URI symptoms.  History of Present Illness:  URI Symptoms      This is a 50 year old woman who presents with URI symptoms.  The symptoms began 4 days ago.  The severity is described as moderate.  The patient reports nasal congestion, clear nasal discharge, sore throat, and earache, but denies dry cough, productive cough, and sick contacts.  Associated symptoms include low-grade fever (<100.5 degrees).  The patient denies dyspnea, wheezing, diarrhea, and use of an antipyretic.  The patient also reports itchy throat, sneezing, headache, and severe fatigue.  The patient denies response to antihistamine and muscle aches.  The patient denies the following risk factors for Strep sinusitis: tooth pain, Strep exposure, and tender adenopathy.    Current Medications (verified): 1)  Proair Hfa 108 (90 Base) Mcg/act Aers (Albuterol Sulfate) .... 2 Puffs Four Times Per Day As Needed 2)  Bupropion Hcl 150 Mg Xr24h-Tab (Bupropion Hcl) .Marland Kitchen.. 1 By Mouth Qam 3)  Singulair 10 Mg Tabs (Montelukast Sodium) .Marland Kitchen.. 1 By Mouth Once Daily 4)  Nasacort Aq 55 Mcg/act Aers (Triamcinolone Acetonide(Nasal)) .Marland Kitchen.. 1 Spray Each Nostril Daily 5)  Vicodin 5-500 Mg Tabs (Hydrocodone-Acetaminophen) .Marland Kitchen.. 1 Q4h As Needed Pain 6)   Temazepam 15 Mg Caps (Temazepam) .Marland Kitchen.. 1 By Mouth At Bedtime 7)  Herbal Suppl. Tumeric, Boswellia & Luten .... Daily 8)  Multivitamins  Tabs (Multiple Vitamin) .Marland Kitchen.. 1 Once Daily  Allergies (verified): No Known Drug Allergies  Past History:  Past Medical History: Allergic rhinitis Asthma Depression FMS  Review of Systems  The patient denies decreased hearing, hoarseness, and syncope.    Physical Exam  General:  Well-developed,well-nourished,in no acute distress; alert,appropriate and cooperative throughout examination Head:  tenderness to palp of frontal sinuses Ears:  hazy TM bilaterally - no effusion apparent, no erythema, hearing grossly normal Mouth:  teeth and gums in good repair; mucous membranes moist, without lesions or ulcers. oropharynx clear without exudate, min erythema. +PND Lungs:  normal respiratory effort, no intercostal retractions or use of accessory muscles; normal breath sounds bilaterally - no crackles and no wheezes.    Heart:  normal rate, regular rhythm, no murmur, and no rub. BLE without edema.   Impression & Recommendations:  Problem # 1:  OTHER ACUTE SINUSITIS (ICD-461.8)  abx, cont nasal steroids and antihistamine -  also meclizine for dizzy symptoms -  work note next 48h Her updated medication list for this problem includes:    Nasacort Aq 55 Mcg/act Aers (Triamcinolone acetonide(nasal)) .Marland Kitchen... 1 spray each nostril daily    Azithromycin 250 Mg Tabs (Azithromycin) .Marland Kitchen... 2 tabs by  mouth today, then 1 by mouth daily starting tomorrow  Instructed on treatment. Call if symptoms persist or worsen.   Orders: Prescription Created Electronically [phone])  Complete Medication List: 1)  Proair Hfa 108 (90 Base) Mcg/act Aers (Albuterol sulfate) .... 2 puffs four times per day as needed 2)  Bupropion Hcl 150 Mg Xr24h-tab (Bupropion hcl) .Marland Kitchen.. 1 by mouth qam 3)  Singulair 10 Mg Tabs (Montelukast sodium) .Marland Kitchen.. 1 by mouth once daily 4)  Nasacort Aq 55 Mcg/act  Aers (Triamcinolone acetonide(nasal)) .Marland Kitchen.. 1 spray each nostril daily 5)  Vicodin 5-500 Mg Tabs (Hydrocodone-acetaminophen) .Marland Kitchen.. 1 q4h as needed pain 6)  Temazepam 15 Mg Caps (Temazepam) .Marland Kitchen.. 1 by mouth at bedtime 7)  Herbal Suppl. Tumeric, Boswellia & Luten  .... Daily 8)  Multivitamins Tabs (Multiple vitamin) .Marland Kitchen.. 1 once daily 9)  Azithromycin 250 Mg Tabs (Azithromycin) .... 2 tabs by mouth today, then 1 by mouth daily starting tomorrow 10)  Meclizine Hcl 12.5 Mg Tabs (Meclizine hcl) .Marland Kitchen.. 1 by mouth every 6 hours as needed for dizziness - may cause sedation  Patient Instructions: 1)  it was good to see you today. 2)  antibiotics for sinus infection and meclizine for dizzy symptoms as discussed - your prescriptions have been electronically submitted to your pharmacy. Please take as directed. Contact our office if you believe you're having problems with the medication(s).  3)  continue the nasal spray and singular - 4)  work note as discussed - to return Wed 6.22 5)  Please schedule a follow-up appointment as needed. Prescriptions: MECLIZINE HCL 12.5 MG TABS (MECLIZINE HCL) 1 by mouth every 6 hours as needed for dizziness - may cause sedation  #30 x 1   Entered and Authorized by:   Newt Lukes MD   Signed by:   Newt Lukes MD on 11/08/2009   Method used:   Electronically to        [facility]* (retail)       [address]       [place], [place]  [address]       Ph: [phone]       Fax: [phone]   RxID:   [id number] AZITHROMYCIN 250 MG TABS (AZITHROMYCIN) 2 tabs by mouth today, then 1 by mouth daily starting tomorrow  #6 x 0   Entered and Authorized by:   Newt Lukes MD   Signed by:   Newt Lukes MD on 11/08/2009   Method used:   Electronically to        [facility]* (retail)       [address]       [place], [place]  [id number]       Ph: [phone]       Fax: [phone]   RxID:   [id number]

## 2010-06-23 NOTE — Progress Notes (Signed)
Summary: 90 day supp Singular/Wellbutrin  Phone Note Refill Request Call back at Home Phone [phone]   Refills Requested: Medication #1:  BUPROPION HCL 150 MG XR24H-TAB 1 by mouth qAM  Medication #2:  SINGULAIR 10 MG TABS 1 by mouth once daily REQ 90 day supply, did not give name of pharmacy  Initial call taken by: Lamar Sprinkles, CMA,  May 04, 2010 5:34 PM  Follow-up for Phone Call        LMOVM for pt to call back and provide name of pharmacy.Alvy Beal Archie CMA  May 05, 2010 8:29 AM   Additional Follow-up for Phone Call Additional follow up Details #1::         pt left vm stating she uses [facility] Additional Follow-up by: Lanier Prude, [facility]),  May 05, 2010 11:38 AM    Additional Follow-up for Phone Call Additional follow up Details #2::    pt informed [facility] Follow-up by: Lanier Prude, [facility]),  May 05, 2010 11:40 AM  Prescriptions: SINGULAIR 10 MG TABS (MONTELUKAST SODIUM) 1 by mouth once daily  #90 x 2   Entered by:   Lanier Prude, [facility])   Authorized by:   Jacques Navy MD   Signed by:   Lanier Prude, CMA(AAMA) on 05/05/2010   Method used:   Electronically to        [facility]  [phone]* (retail)       [address] [place], [place]  [address]       Ph: [phone] or [phone]       Fax: [phone]   RxID:   [id number] BUPROPION HCL 150 MG XR24H-TAB (BUPROPION HCL) 1 by mouth qAM  #90 x 2   Entered by:   Lanier Prude, CMA(AAMA)   Authorized by:   Jacques Navy MD   Signed by:   Lanier Prude, CMA(AAMA) on 05/05/2010   Method used:   Electronically to        [facility]  [phone]* (retail)       [address] [place], [place]  [address]       Ph: [phone] or [phone]       Fax: [phone]   RxID:   [id number]

## 2010-08-12 ENCOUNTER — Telehealth: Payer: Self-pay | Admitting: *Deleted

## 2010-08-12 NOTE — Telephone Encounter (Signed)
Patient requesting apt today or Monday. C/o appetite issues, night sweats, crying easily and other issues. Please advise.

## 2010-08-12 NOTE — Telephone Encounter (Signed)
Ok with me to add on Monday

## 2010-08-12 NOTE — Telephone Encounter (Signed)
Spoke w/pt, she feels that problems are hormonal, scheduled an apt with GYN and will f/u w/us if needed

## 2010-10-03 ENCOUNTER — Other Ambulatory Visit: Payer: Self-pay | Admitting: Internal Medicine

## 2010-10-04 NOTE — Op Note (Signed)
NAMEHAZELINE, CHARNLEY               ACCOUNT NO.:  [id number]   MEDICAL RECORD NO.:  [id number]          PATIENT TYPE:  INP   LOCATION:  5125                         FACILITY:  [facility]   PHYSICIAN:  Hilda Lias, M.D.   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   PROCEDURE:  Right L5 hemilaminotomy, right 4-5 removal of synovial cyst,  diskectomy and foraminotomy of right 4-5 and 5-1, microscope.   SURGEON:  Hilda Lias, M.D.   ASSISTANT:  Stefani Dama, M.D.   CLINICAL HISTORY:  The patient had been complaining of back pain with  radiation down to the right leg.  The patient had failed conservative  treatment.  She was seen by an orthopedic surgeon, who advised surgery.  The patient wanted to proceed with surgery because she could not live  with the pain.   DESCRIPTION OF PROCEDURE:  The patient was taken to the OR and she was  positioned in a prone manner.  The back was cleaned with DuraPrep.  A  midline incision from L4-5 to L5-S1 was made and the muscles were  retracted laterally.  Then, we identified 4-5 and L5-1.  The area was  quite vertical and the patient had quite a lot of dural sac.  Because of  that, we proceeded with a hemilaminectomy of L5, and we worked our way  from the top down.  With the microscope, we identified the dural sac,  and we identified the L4-5 space.  There was a mild bulging disk and  there was a small cyst right at the takeoff of the foramen.  Decompression of the L5 nerve roots, as well as foraminotomy at 4-5 were  achieved.  The L4 nerve roots were quite open.  Then, we proceeded to  retract the L5 area.  Part of the dura mater was attached to the disk.  Part of the disk was calcified and going  into the foramen.  Incision of  the disk was made and we retracted the dural sac.  There was a small  opening in the arachnoid space.  Retraction was achieved at the end  using hand-held retraction, and we were able to get into the disk space.  A large amount of degenerative disk, mostly medial and lateral, was  removed.  At the end, we had a good decompression of the S1 nerve roots.  We did Valsalva to 40 and there was no evidence of any CSF leak.  Nevertheless, because we saw some CSF leak during retraction, we put a  small amount of a muscle patch with Tisseel on top.  Again, Valsalva  maneuver was done up to 40 and was negative.  From then on, the area was  irrigated and the wound was closed with Vicryl and Steri-Strips and  nylon.  The patient is going to go to the PACU.  ______________________________  Hilda Lias, M.D.    EB/MEDQ  D:  11/30/2006  T:  12/01/2006  Job:  [id number]   cc:   Dr Sandria Manly

## 2010-10-04 NOTE — H&P (Signed)
Jean Blevins, Jean Blevins               ACCOUNT NO.:  [id number]   MEDICAL RECORD NO.:  [id number]          PATIENT TYPE:  INP   LOCATION:  5125                         FACILITY:  [facility]   PHYSICIAN:  Hilda Lias, M.D.   DATE OF BIRTH:  Nov 24, 1960   DATE OF ADMISSION:  11/30/2006  DATE OF DISCHARGE:                              HISTORY & PHYSICAL   Ms. Piazza is a lady who was seen by me in my office because of history of  back pain with radiation to the right leg which has been going on for  several months.  The pain is getting worse.  She had been seen by a  neurologist.  She is quite miserable.  When she sits, she has tended to  lift the right buttock to prevent any pain.  The patient had been seen  by a different surgeon who advised surgery.  She came to [place] for second  opinion.   PAST MEDICAL HISTORY:  1. Appendectomy.  2. Cholecystectomy.  3. Right foot surgery.   SOCIAL HISTORY:  The patient does not smoke.  She drinks socially   REVIEW OF SYSTEMS:  Positive back pain, asthma, abdominal pain,  depression.  The patient came to my office limping from the right leg.   PHYSICAL EXAMINATION:  HEAD, EARS, NOSE AND THROAT: Normal.  NECK:  Normal.  LUNGS:  Clear.  CARDIAC:  Heart sounds normal.  EXTREMITIES:  Lower extremity have normal pulses.  NEUROLOGIC:  She has weakness on dorsiflexion of the right foot as well  as plantar flexion.  Sensation is normal.  Reflexes symmetrical.   The MRI and the myelogram showed that she has a herniated disk at L5-1  central to the right and a small synovial cyst at the level of 4-5 to  the right.   CLINICAL IMPRESSION:  1. 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  2. Right 5-1 herniated disk.   RECOMMENDATIONS:  The patient is going to proceed with surgery.  Procedure will be right 4-5 removal of the cyst as well as diskectomy at  5-1.  The patient knows about the risks such as infection, CSF leak,  bleeding, worsening pain, need for further surgery,  damage to the  vessels of the abdomen.          ______________________________  Hilda Lias, M.D.    EB/MEDQ  D:  11/30/2006  T:  12/01/2006  Job:  [id number]

## 2010-10-04 NOTE — Discharge Summary (Signed)
Jean Blevins, Jean Blevins               ACCOUNT NO.:  [id number]   MEDICAL RECORD NO.:  [id number]          PATIENT TYPE:  INP   LOCATION:  3009                         FACILITY:  [facility]   PHYSICIAN:  Hilda Lias, M.D.   DATE OF BIRTH:  [birth year]   DATE OF ADMISSION:  11/30/2006  DATE OF DISCHARGE:  12/04/2006                               DISCHARGE SUMMARY   ADMISSION DIAGNOSES:  1. Synovial cyst 4-5.  2. Herniated disk 5-1.   FINAL DIAGNOSES:  1. Synovial cyst 4-5.  2. Herniated disk 5-1.   CLINICAL HISTORY:  The patient was admitted because of back pain with  radiation to the right leg.  She had failed conservative treatment.  X-  rays showed synovial cyst and at the level 4-5 on the right side and a  herniated disk the level of 5-1.   LABORATORY DATA:  Normal.   COURSE IN THE HOSPITAL:  The patient was taken to surgery, and right 4-5  removal of cyst and 5-1 diskectomy were achieved.  The patient was kept  flat in bed for 72 hours to avoid spinal headache.  Today she is feeling  much better.  She had no headache at the base. Much has improved.  She  is going to go home to be followed by me in my office a week from today.   CONDITION ON DISCHARGE:  Improvement.   MEDICATIONS:  Percocet, diazepam.   DIET:  Regular.   ACTIVITY:  Not to do any bending.           ______________________________  Hilda Lias, M.D.     EB/MEDQ  D:  12/04/2006  T:  12/04/2006  Job:  [id number]

## 2010-10-07 NOTE — Op Note (Signed)
NAME:  Jean Blevins, Jean Blevins               ACCOUNT NO.:  [id number]   MEDICAL RECORD NO.:  [id number]          PATIENT TYPE:  AMB   LOCATION:  SDC                           FACILITY:  [facility]   PHYSICIAN:  [place] B. Earlene Plater, M.D.  DATE OF BIRTH:  1960-12-02   DATE OF PROCEDURE:  09/19/2006  DATE OF DISCHARGE:                               OPERATIVE REPORT   PREOPERATIVE DIAGNOSIS:  Persistent abnormal uterine bleeding and  dysmenorrhea after previous endometrial ablation.   POSTOPERATIVE DIAGNOSIS:  Persistent abnormal uterine bleeding and  dysmenorrhea after previous endometrial ablation.   PROCEDURE:  Total laparoscopic hysterectomy.   SURGEON:  Chester Holstein. Earlene Plater, M.D.   ASSISTANT:  Cordelia Pen A. Rosalio Macadamia, M.D.   ANESTHESIA:  General.   SPECIMENS:  Uterus and cervix to pathology.   BLOOD LOSS:  200.   COMPLICATIONS:  None.   INDICATIONS:  The patient with a history of previous endometrial  ablation several years ago with persistent dysmenorrhea and menorrhagia.  Recent ultrasound showed a thin endometrial stripe with hematometra,  previous tubal ligation, desired definitive treatment. The patient was  advised the risks of surgery including infection, bleeding, damage to  surrounding origins. I recommended laparoscopic hysterectomy as she had  no descent and very narrow pubic angle. Also no previous history of  pregnancy.   PROCEDURE:  The patient was taken to the operating room and general  anesthesia obtained. She was prepped and draped in standard fashion.  Foley catheter inserted into the bladder.   Weighted speculum was inserted. Single tooth placed on the anterior lip  of the cervix. The uterus could not be sounded apparently due to  scarring from the previous ablation. Therefore, sponge stick was placed  in the vagina, soaked with indigo carmine.   A 10-mm incision was placed in the umbilicus, carried sharply to the  fascia. The fascia was divided sharply and elevated with  Kocher clamps.  The posterior sheath and peritoneum elevated and entered sharply.  Pursestring suture of 0 Vicryl placed around the fascial defect. Hasson  cannula inserted and secured. Pneumoperitoneum with CO2 gas. An 11-mm XL  trocar was placed in the left lower quadrant, 5 mm in the right and 5 mm  suprapubically to facilitate mobilization and stabilization of the  uterus given the lack of uterine manipulation from below.   Course of each ureter was identified and found to be well away. Left  round ligament was placed on traction, divided with the Gyrus bipolar.  Tube and ovary similarly sealed and divided. Bladder flap created  sharply, and entire procedure repeated on the right side in the same  manner. Left uterine artery was skeletonized and sealed with the Gyrus.  Repeat on the right with the Gyrus as well. Sponge stick was placed into  the anterior fornix, and the anterior colpotomy made with a plasma  spatula. This was carried around to the vagina angles and then posterior  colpotomy made similarly. The intervening angles were taken down with  the Gyrus bipolar. Hemostasis obtained. Uterus was delivered into the  vagina which maintained pneumoperitoneum.   Vaginal cuff was  then closed with interrupted stitches of 0 Vicryl  laparoscopically. Hemostasis obtained. Pelvis was irrigated. Lines of  dissection inspected and found to be hemostatic.   The inferior ports were removed, and their sites were all hemostatic  laparoscopically. Scope was removed. Gas was released. Hasson cannula  removed. Umbilical incision elevated. Pursestring suture snugged down.  No intra-abdominal contents were noted through it prior to closure. Skin  was closed at each site with Dermabond.   The patient tolerated the procedure well with no complications. She was  taken to the recovery room awake, alert, in stable condition. All counts  correct per the operating staff.      Gerri Spore B. Earlene Plater,  M.D.  Electronically Signed     WBD/MEDQ  D:  09/19/2006  T:  09/19/2006  Job:  [id number]

## 2011-01-13 ENCOUNTER — Other Ambulatory Visit: Payer: Self-pay | Admitting: Internal Medicine

## 2011-03-07 LAB — RAPID URINE DRUG SCREEN, HOSP PERFORMED
Benzodiazepines: NOT DETECTED
Cocaine: NOT DETECTED

## 2011-03-07 LAB — CBC
HCT: 41
Hemoglobin: 13.9
MCHC: 34
MCV: 87.7
RBC: 4.68
WBC: 7.9

## 2011-03-24 ENCOUNTER — Encounter: Payer: Self-pay | Admitting: Internal Medicine

## 2011-03-24 ENCOUNTER — Ambulatory Visit (INDEPENDENT_AMBULATORY_CARE_PROVIDER_SITE_OTHER): Payer: 59 | Admitting: Internal Medicine

## 2011-03-24 VITALS — BP 102/70 | HR 62 | Temp 98.8°F

## 2011-03-24 DIAGNOSIS — H6503 Acute serous otitis media, bilateral: Secondary | ICD-10-CM

## 2011-03-24 DIAGNOSIS — H6500 Acute serous otitis media, unspecified ear: Secondary | ICD-10-CM

## 2011-03-24 DIAGNOSIS — J069 Acute upper respiratory infection, unspecified: Secondary | ICD-10-CM

## 2011-03-24 MED ORDER — LORATADINE-PSEUDOEPHEDRINE ER 5-120 MG PO TB12: 1.0000 | ORAL_TABLET | Freq: Two times a day (BID) | ORAL | Status: AC

## 2011-03-24 MED ORDER — AZITHROMYCIN 250 MG PO TABS: ORAL_TABLET | ORAL | Status: AC

## 2011-03-24 NOTE — Progress Notes (Signed)
  Subjective:    Patient ID: Jean Blevins, female    DOB: [birth year], 50 y.o.   MRN: [id number]  HPI  complains of sore throat and ear pain, bilaterally Onset 3 days ago "Pressure feeling" in face over frontal and maxillary sinus, but no nasal discharge +sick contacts at work No fever or sputum, dry cough last pm - none today No flare of asthma No relief with OTC meds thus far  Past Medical History  Diagnosis Date  . Depression   . Asthma   . Allergic rhinitis     Review of Systems  Constitutional: Negative for chills and diaphoresis.  HENT: Positive for congestion and sinus pressure. Negative for hearing loss, facial swelling, rhinorrhea, neck pain, neck stiffness, dental problem, postnasal drip, tinnitus and ear discharge.   Eyes: Negative for pain and visual disturbance.  Respiratory: Negative for shortness of breath and wheezing.   Cardiovascular: Negative for chest pain and palpitations.  Neurological: Negative for dizziness and headaches.       Objective:   Physical Exam BP 102/70  Pulse 62  Temp(Src) 98.8 F (37.1 C) (Oral)  SpO2 97% Wt Readings from Last 3 Encounters:  03/17/10 172 lb (78.019 kg)  01/14/10 168 lb (76.204 kg)  11/08/09 172 lb (78.019 kg)   Constitutional: She appears tired but well-developed and well-nourished.  HENT: Head: Normocephalic and atraumatic. Ears: B TMs hazt with serous effusion, no erythema; Nose: pale swollen turbinates, no discharge; Mouth/Throat: Oropharynx is red but clear and moist. No oropharyngeal exudate.  Eyes: Conjunctivae and EOM are normal. Pupils are equal, round, and reactive to light. No scleral icterus.  Neck: Normal range of motion. Neck supple. No JVD present. No thyromegaly present.  Cardiovascular: Normal rate, regular rhythm and normal heart sounds.  No murmur heard. No BLE edema. Pulmonary/Chest: Effort normal and breath sounds normal. No respiratory distress. She has no wheezes.       Assessment & Plan:    B serous OM, likely precipitated by viral syndrome URI, acute - symptomatic care advised declines nasal steroids - also empiric Zpak, advised decongestants and antihist -  to call if worse or unimporved

## 2011-03-24 NOTE — Patient Instructions (Signed)
It was good to see you today. Zpak antibiotics for your ear and sinus infection - Your prescription(s) have been submitted to your pharmacy. Please take as directed and contact our office if you believe you are having problem(s) with the medication(s). Use Claritin D 12h each AM and at night use a combination cough/cold nighttime formula (such as Nyquil) Keep hydrated and use tylenol or ibuprofen as needed for aches

## 2011-08-10 ENCOUNTER — Other Ambulatory Visit: Payer: Self-pay | Admitting: Internal Medicine

## 2012-02-27 ENCOUNTER — Ambulatory Visit (INDEPENDENT_AMBULATORY_CARE_PROVIDER_SITE_OTHER): Payer: 59 | Admitting: Internal Medicine

## 2012-02-27 ENCOUNTER — Encounter: Payer: Self-pay | Admitting: Internal Medicine

## 2012-02-27 VITALS — BP 102/80 | HR 68 | Temp 98.1°F | Resp 16 | Wt 169.0 lb

## 2012-02-27 DIAGNOSIS — J45909 Unspecified asthma, uncomplicated: Secondary | ICD-10-CM

## 2012-02-27 DIAGNOSIS — J209 Acute bronchitis, unspecified: Secondary | ICD-10-CM

## 2012-02-27 DIAGNOSIS — H6690 Otitis media, unspecified, unspecified ear: Secondary | ICD-10-CM

## 2012-02-27 MED ORDER — METHYLPREDNISOLONE ACETATE 80 MG/ML IJ SUSP
120.0000 mg | Freq: Once | INTRAMUSCULAR | Status: AC
Start: 2012-02-27 — End: 2012-02-27
  Administered 2012-02-27: 120 mg via INTRAMUSCULAR

## 2012-02-27 MED ORDER — DOXYCYCLINE HYCLATE 100 MG PO TABS: 100.0000 mg | ORAL_TABLET | Freq: Two times a day (BID) | ORAL | Status: DC

## 2012-02-27 MED ORDER — PROMETHAZINE-CODEINE 6.25-10 MG/5ML PO SYRP: 5.0000 mL | ORAL_SOLUTION | ORAL | Status: AC | PRN

## 2012-02-27 MED ORDER — MONTELUKAST SODIUM 10 MG PO TABS: 10.0000 mg | ORAL_TABLET | Freq: Every day | ORAL | Status: DC

## 2012-02-27 MED ORDER — ALBUTEROL SULFATE HFA 108 (90 BASE) MCG/ACT IN AERS: 2.0000 | INHALATION_SPRAY | Freq: Four times a day (QID) | RESPIRATORY_TRACT | Status: DC | PRN

## 2012-02-27 MED ORDER — MOMETASONE FUROATE 220 MCG/INH IN AEPB: 2.0000 | INHALATION_SPRAY | Freq: Every day | RESPIRATORY_TRACT | Status: DC

## 2012-02-27 NOTE — Assessment & Plan Note (Signed)
Doxy 100 mg bid x 10 d 

## 2012-02-27 NOTE — Assessment & Plan Note (Signed)
Restart Singulair in 1 wk

## 2012-02-27 NOTE — Progress Notes (Signed)
   Subjective:    Patient ID: Jean Blevins, female    DOB: 01-18-1961, 51 y.o.   MRN: [id number]  Cough This is a new problem. The current episode started in the past 7 days. The problem has been gradually worsening. The cough is productive of blood-tinged sputum. Associated symptoms include ear congestion and ear pain. Pertinent negatives include no chest pain, chills, headaches, postnasal drip, rhinorrhea, shortness of breath or wheezing. Associated symptoms comments: L>R. She has tried a beta-agonist inhaler for the symptoms. The treatment provided mild relief. Her past medical history is significant for asthma.  Ear Fullness  There is pain in both ears. The current episode started in the past 7 days. The pain is at a severity of 4/10. Associated symptoms include coughing. Pertinent negatives include no ear discharge, headaches, hearing loss, neck pain or rhinorrhea. The treatment provided no relief.     Past Medical History  Diagnosis Date  . Depression   . Asthma   . Allergic rhinitis     Review of Systems  Constitutional: Negative for chills and diaphoresis.  HENT: Positive for ear pain, congestion and sinus pressure. Negative for hearing loss, facial swelling, rhinorrhea, neck pain, neck stiffness, dental problem, postnasal drip, tinnitus and ear discharge.   Eyes: Negative for pain and visual disturbance.  Respiratory: Positive for cough. Negative for shortness of breath and wheezing.   Cardiovascular: Negative for chest pain and palpitations.  Neurological: Negative for dizziness and headaches.       Objective:   Physical Exam BP 102/80  Pulse 68  Temp 98.1 F (36.7 C) (Oral)  Resp 16  Wt 169 lb (76.658 kg) Wt Readings from Last 3 Encounters:  02/27/12 169 lb (76.658 kg)  03/17/10 172 lb (78.019 kg)  01/14/10 168 lb (76.204 kg)   Constitutional: She appears tired but well-developed and well-nourished.  HENT: Head: Normocephalic and atraumatic. Ears: B TMs hazt with  serous effusion, mild  B erythema; Nose: pale swollen turbinates, no discharge; Mouth/Throat: Oropharynx is red but clear and moist. No oropharyngeal exudate.  Eyes: Conjunctivae and EOM are normal. Pupils are equal, round, and reactive to light. No scleral icterus.  Neck: Normal range of motion. Neck supple. No JVD present. No thyromegaly present.  Cardiovascular: Normal rate, regular rhythm and normal heart sounds.  No murmur heard. No BLE edema. Pulmonary/Chest: Effort normal and breath sounds normal. No respiratory distress. She has no wheezes.    I personally provided the Asmanex twisthaler use teaching. After the teaching patient was able to demonstrate it's use effectively. All questions were answered      Assessment & Plan:

## 2012-02-27 NOTE — Assessment & Plan Note (Signed)
Proair Depo 120 mg IM Asmanex

## 2012-02-28 ENCOUNTER — Encounter: Payer: Self-pay | Admitting: Internal Medicine

## 2012-04-23 ENCOUNTER — Ambulatory Visit (INDEPENDENT_AMBULATORY_CARE_PROVIDER_SITE_OTHER): Payer: 59 | Admitting: Internal Medicine

## 2012-04-23 ENCOUNTER — Encounter: Payer: Self-pay | Admitting: Internal Medicine

## 2012-04-23 VITALS — BP 108/64 | HR 83 | Temp 97.9°F | Resp 10 | Wt 169.0 lb

## 2012-04-23 DIAGNOSIS — J018 Other acute sinusitis: Secondary | ICD-10-CM

## 2012-04-23 MED ORDER — FLUCONAZOLE 150 MG PO TABS: 150.0000 mg | ORAL_TABLET | Freq: Once | ORAL | Status: DC

## 2012-04-23 MED ORDER — AMOXICILLIN-POT CLAVULANATE 875-125 MG PO TABS: 1.0000 | ORAL_TABLET | Freq: Two times a day (BID) | ORAL | Status: DC

## 2012-04-23 NOTE — Patient Instructions (Addendum)
Sinus infection: 10 day h/o sinus congestion, pain, swelling and intermittent purulent drainage. Tender on exam, especially left maxillary sinus  Plan Augmentin 875 bid x 10 days  Sudafed 30 mg two or three times a day  Diflucan stat 150 mg to take after completing antibiotic if you get yeast infection.  Supportive care.- mucinex 1200 mg to keep the mucus flowing, if absolutely necessary loratadine as an antihistamine, tylenol 1000 mg three times a day for aches, fevers, hydrate, vitamin C 1500 mg daily, Ecchinacea - form of choice as an immune modulator.  Sinusitis Sinusitis is redness, soreness, and swelling (inflammation) of the paranasal sinuses. Paranasal sinuses are air pockets within the bones of your face (beneath the eyes, the middle of the forehead, or above the eyes). In healthy paranasal sinuses, mucus is able to drain out, and air is able to circulate through them by way of your nose. However, when your paranasal sinuses are inflamed, mucus and air can become trapped. This can allow bacteria and other germs to grow and cause infection. Sinusitis can develop quickly and last only a short time (acute) or continue over a long period (chronic). Sinusitis that lasts for more than 12 weeks is considered chronic.   CAUSES   Causes of sinusitis include:  Allergies.   Structural abnormalities, such as displacement of the cartilage that separates your nostrils (deviated septum), which can decrease the air flow through your nose and sinuses and affect sinus drainage.   Functional abnormalities, such as when the small hairs (cilia) that line your sinuses and help remove mucus do not work properly or are not present.  SYMPTOMS   Symptoms of acute and chronic sinusitis are the same. The primary symptoms are pain and pressure around the affected sinuses. Other symptoms include:  Upper toothache.   Earache.   Headache.   Bad breath.   Decreased sense of smell and taste.   A cough, which  worsens when you are lying flat.   Fatigue.   Fever.   Thick drainage from your nose, which often is green and may contain pus (purulent).   Swelling and warmth over the affected sinuses.  DIAGNOSIS   Your caregiver will perform a physical exam. During the exam, your caregiver may:  Look in your nose for signs of abnormal growths in your nostrils (nasal polyps).   Tap over the affected sinus to check for signs of infection.   View the inside of your sinuses (endoscopy) with a special imaging device with a light attached (endoscope), which is inserted into your sinuses.  If your caregiver suspects that you have chronic sinusitis, one or more of the following tests may be recommended:  Allergy tests.   Nasal culture A sample of mucus is taken from your nose and sent to a lab and screened for bacteria.   Nasal cytology A sample of mucus is taken from your nose and examined by your caregiver to determine if your sinusitis is related to an allergy.  TREATMENT   Most cases of acute sinusitis are related to a viral infection and will resolve on their own within 10 days. Sometimes medicines are prescribed to help relieve symptoms (pain medicine, decongestants, nasal steroid sprays, or saline sprays).   However, for sinusitis related to a bacterial infection, your caregiver will prescribe antibiotic medicines. These are medicines that will help kill the bacteria causing the infection.   Rarely, sinusitis is caused by a fungal infection. In theses cases, your caregiver will prescribe antifungal medicine.  For some cases of chronic sinusitis, surgery is needed. Generally, these are cases in which sinusitis recurs more than 3 times per year, despite other treatments. HOME CARE INSTRUCTIONS    Drink plenty of water. Water helps thin the mucus so your sinuses can drain more easily.   Use a humidifier.   Inhale steam 3 to 4 times a day (for example, sit in the bathroom with the shower running).     Apply a warm, moist washcloth to your face 3 to 4 times a day, or as directed by your caregiver.   Use saline nasal sprays to help moisten and clean your sinuses.   Take over-the-counter or prescription medicines for pain, discomfort, or fever only as directed by your caregiver.  SEEK IMMEDIATE MEDICAL CARE IF:  You have increasing pain or severe headaches.   You have nausea, vomiting, or drowsiness.   You have swelling around your face.   You have vision problems.   You have a stiff neck.   You have difficulty breathing.  MAKE SURE YOU:    Understand these instructions.   Will watch your condition.   Will get help right away if you are not doing well or get worse.  Document Released: 05/08/2005 Document Revised: 07/31/2011 Document Reviewed: 05/23/2011 [facility] Patient Information 2013 [place], [place].

## 2012-04-23 NOTE — Assessment & Plan Note (Signed)
10 day h/o sinus congestion, pain, swelling and intermittent purulent drainage. Tender on exam, especially left maxillary sinus  Plan Augmentin 875 bid x 10 days  Sudafed 30 mg two or three times a day  Diflucan stat 150 mg to take after completing antibiotic if you get yeast infection.  Supportive care.

## 2012-04-27 NOTE — Progress Notes (Signed)
  Subjective:    Patient ID: Jean Blevins, female    DOB: 10-25-1960, 51 y.o.   MRN: [id number]  HPI Jean Blevins presents with a 10 day /o sinus pressure, intermittent purulent drainager/rhinorrhea. Question of low grade fever. OTC medications have not helped  Past Medical History  Diagnosis Date  . Depression   . Asthma   . Allergic rhinitis    History reviewed. No pertinent past surgical history. History reviewed. No pertinent family history. History   Social History  . Marital Status: Divorced    Spouse Name: N/A    Number of Children: N/A  . Years of Education: N/A   Occupational History  . Not on file.   Social History Main Topics  . Smoking status: Never Smoker   . Smokeless tobacco: Not on file  . Alcohol Use: Not on file  . Drug Use: Not on file  . Sexually Active: Not on file   Other Topics Concern  . Not on file   Social History Narrative  . No narrative on file    Current Outpatient Prescriptions on File Prior to Visit  Medication Sig Dispense Refill  . albuterol (PROAIR HFA) 108 (90 BASE) MCG/ACT inhaler Inhale 2 puffs into the lungs every 6 (six) hours as needed for wheezing.  8.5 each  5  . buPROPion (WELLBUTRIN XL) 150 MG 24 hr tablet TAKE 1 TABLET EVERY MORNING  90 tablet  0  . doxycycline (VIBRA-TABS) 100 MG tablet Take 1 tablet (100 mg total) by mouth 2 (two) times daily.  20 tablet  0  . mometasone (ASMANEX 30 METERED DOSES) 220 MCG/INH inhaler Inhale 2 puffs into the lungs daily.  1 Inhaler  12  . montelukast (SINGULAIR) 10 MG tablet Take 1 tablet (10 mg total) by mouth at bedtime.  30 tablet  11      Review of Systems System review is negative for any constitutional, cardiac, pulmonary, GI or neuro symptoms or complaints other than as described in the HPI.     Objective:   Physical Exam Filed Vitals:   04/23/12 1141  BP: 108/64  Pulse: 83  Temp: 97.9 F (36.6 C)  Resp: 10   Gen'l- WNWD woman in no distress HEENT - Tender to  percussion over the sinus especially left maxillary Neck- supple Nodes - neg Cor - RRR Pulm - normal respirations, no rales or wheezing       Assessment & Plan:  Sinus infection: 10 day h/o sinus congestion, pain, swelling and intermittent purulent drainage. Tender on exam, especially left maxillary sinus  Plan Augmentin 875 bid x 10 days  Sudafed 30 mg two or three times a day  Diflucan stat 150 mg to take after completing antibiotic if you get yeast infection.  Supportive care.- mucinex 1200 mg to keep the mucus flowing, if absolutely necessary loratadine as an antihistamine, tylenol 1000 mg three times a day for aches, fevers, hydrate, vitamin C 1500 mg daily, Ecchinacea - form of choice as an immune modulator.

## 2012-09-03 ENCOUNTER — Ambulatory Visit (INDEPENDENT_AMBULATORY_CARE_PROVIDER_SITE_OTHER)
Admission: RE | Admit: 2012-09-03 | Discharge: 2012-09-03 | Disposition: A | Discharge status: Home / Self Care | Payer: Worker's Compensation | Source: Ambulatory Visit | Attending: Internal Medicine | Admitting: Internal Medicine

## 2012-09-03 ENCOUNTER — Encounter: Payer: Self-pay | Admitting: Internal Medicine

## 2012-09-03 ENCOUNTER — Ambulatory Visit (INDEPENDENT_AMBULATORY_CARE_PROVIDER_SITE_OTHER): Payer: Worker's Compensation | Admitting: Internal Medicine

## 2012-09-03 VITALS — BP 124/82 | HR 61 | Temp 98.2°F | Ht 63.0 in | Wt 169.0 lb

## 2012-09-03 DIAGNOSIS — M79672 Pain in left foot: Secondary | ICD-10-CM

## 2012-09-03 DIAGNOSIS — M25532 Pain in left wrist: Secondary | ICD-10-CM

## 2012-09-03 DIAGNOSIS — M79609 Pain in unspecified limb: Secondary | ICD-10-CM

## 2012-09-03 DIAGNOSIS — S139XXA Sprain of joints and ligaments of unspecified parts of neck, initial encounter: Secondary | ICD-10-CM

## 2012-09-03 DIAGNOSIS — L02411 Cutaneous abscess of right axilla: Secondary | ICD-10-CM

## 2012-09-03 DIAGNOSIS — W19XXXA Unspecified fall, initial encounter: Secondary | ICD-10-CM

## 2012-09-03 DIAGNOSIS — S161XXA Strain of muscle, fascia and tendon at neck level, initial encounter: Secondary | ICD-10-CM

## 2012-09-03 DIAGNOSIS — M25539 Pain in unspecified wrist: Secondary | ICD-10-CM

## 2012-09-03 DIAGNOSIS — IMO0002 Reserved for concepts with insufficient information to code with codable children: Secondary | ICD-10-CM

## 2012-09-03 MED ORDER — CYCLOBENZAPRINE HCL 5 MG PO TABS: 5.0000 mg | ORAL_TABLET | Freq: Three times a day (TID) | ORAL | Status: DC | PRN

## 2012-09-03 MED ORDER — SULFAMETHOXAZOLE-TRIMETHOPRIM 800-160 MG PO TABS: 1.0000 | ORAL_TABLET | Freq: Two times a day (BID) | ORAL | Status: DC

## 2012-09-03 NOTE — Progress Notes (Signed)
Subjective:    Patient ID: Jean Blevins, female    DOB: [birth year], 52 y.o.   MRN: [id number]  HPI  Pt presents to the clinic today with c/o left side pain s/p fall yesterday. She tripped on some carpet while at work and fell pretty hard on her left foot and wrist. She did not hear anything crack or pop but she has been having very intense pain in her wrist and foot since then. She has taken some ibuprofen. She also woke up this morning and felt like her neck was really stiff as if she had been in a car accident. She does have some swelling of the left hand and left foot. She denies bruising. Additionally today, pt c/o of an inflamed sebaceous cyst under her right axilla. She reports that it has been there for years but recently over the past 2 days it has become swollen, tender and red. She thinks it may be infected. She has had it drained in the past.  Review of Systems      Past Medical History  Diagnosis Date  . Depression   . Asthma   . Allergic rhinitis     Current Outpatient Prescriptions  Medication Sig Dispense Refill  . albuterol (PROAIR HFA) 108 (90 BASE) MCG/ACT inhaler Inhale 2 puffs into the lungs every 6 (six) hours as needed for wheezing.  8.5 each  5  . amoxicillin-clavulanate (AUGMENTIN) 875-125 MG per tablet Take 1 tablet by mouth 2 (two) times daily.  20 tablet  0  . buPROPion (WELLBUTRIN XL) 150 MG 24 hr tablet TAKE 1 TABLET EVERY MORNING  90 tablet  0  . doxycycline (VIBRA-TABS) 100 MG tablet Take 1 tablet (100 mg total) by mouth 2 (two) times daily.  20 tablet  0  . fluconazole (DIFLUCAN) 150 MG tablet Take 1 tablet (150 mg total) by mouth once.  1 tablet  0  . mometasone (ASMANEX 30 METERED DOSES) 220 MCG/INH inhaler Inhale 2 puffs into the lungs daily.  1 Inhaler  12  . montelukast (SINGULAIR) 10 MG tablet Take 1 tablet (10 mg total) by mouth at bedtime.  30 tablet  11   No current facility-administered medications for this visit.    No Known  Allergies  History reviewed. No pertinent family history.  History   Social History  . Marital Status: Divorced    Spouse Name: N/A    Number of Children: N/A  . Years of Education: N/A   Occupational History  . Not on file.   Social History Main Topics  . Smoking status: Never Smoker   . Smokeless tobacco: Not on file  . Alcohol Use: Not on file  . Drug Use: Not on file  . Sexually Active: Not on file   Other Topics Concern  . Not on file   Social History Narrative  . No narrative on file     Constitutional: Denies fever, malaise, fatigue, headache or abrupt weight changes.  Musculoskeletal: Pt reports pain in left wrist and left foot. Denies decrease in range of motion, difficulty with gait, muscle pain or joint pain and swelling.  Skin: Sebaceous cyst under right axilla. Denies rashes, lesions or ulcercations.    No other specific complaints in a complete review of systems (except as listed in HPI above).  Objective:   Physical Exam   BP 124/82  Pulse 61  Temp(Src) 98.2 F (36.8 C) (Oral)  Ht 5\' 3"  (1.6 m)  Wt 169 lb (76.658 kg)  BMI  29.94 kg/m2  SpO2 96% Wt Readings from Last 3 Encounters:  09/03/12 169 lb (76.658 kg)  04/23/12 169 lb (76.658 kg)  02/27/12 169 lb (76.658 kg)    General: Appears her stated age, well developed, well nourished in NAD. Skin: Warm, dry and intact. Large abscess noted in right axilla surrounded by area of cellulitis.No rashes, lesions or ulcerations noted..  Cardiovascular: Normal rate and rhythm. S1,S2 noted.  No murmur, rubs or gallops noted. No JVD or BLE edema. No carotid bruits noted. Pulmonary/Chest: Normal effort and positive vesicular breath sounds. No respiratory distress. No wheezes, rales or ronchi noted.  Musculoskeletal: Decreased flexion and extension of the wrist secondary to pain. Pinpoint tenderness of the radial head. Pain noted just below the 4th metatarsal of the foot. No signs of joint swelling. No  difficulty with gait.        Assessment & Plan:  Left wrist pain and left foot pain, s/p fall:  Will obtain xray of left wrist and left foot to r/o hairline fractures Work note provided  Neck strain:  Continue Advil eRx for Flexeril 5 mg po TID prn  Abscess of right axilla:  eRx for septra BID x 7 days Talk with Dr. Debby Bud about possible removal  Will f/u after results of xrays come back

## 2012-09-03 NOTE — Patient Instructions (Signed)

## 2012-09-04 ENCOUNTER — Ambulatory Visit: Payer: 59 | Admitting: Internal Medicine

## 2012-09-05 ENCOUNTER — Telehealth: Payer: Self-pay | Admitting: Internal Medicine

## 2012-09-05 NOTE — Telephone Encounter (Signed)
Pt informed of NP's advisement for appointment tomorrow-appointment scheduled 09/06/2012 at 12:00pm.

## 2012-09-05 NOTE — Telephone Encounter (Signed)
Ok to defer to [place] who saw pt

## 2012-09-05 NOTE — Telephone Encounter (Signed)
Ash, Can you please call Mrs. Pinales and let her know if the cyst is still painful and not getting smaller with the antibiotics that she can make a 30 min appointment for tommorow and open it up to see if it will drain. Rene Kocher

## 2012-09-05 NOTE — Telephone Encounter (Signed)
Autry's Call  Follows office visit 415/15,  taking antibiotic for infected sebacious cyst; still has pain, size of egg (not increased in size) which she has used Ibuprofen 800 mg TID, states not helping the pain; wants to know when it needs to be lanced?  Please call

## 2012-09-06 ENCOUNTER — Encounter: Payer: Self-pay | Admitting: Internal Medicine

## 2012-09-06 ENCOUNTER — Ambulatory Visit (INDEPENDENT_AMBULATORY_CARE_PROVIDER_SITE_OTHER): Payer: PRIVATE HEALTH INSURANCE | Admitting: Internal Medicine

## 2012-09-06 ENCOUNTER — Encounter: Payer: Self-pay | Admitting: *Deleted

## 2012-09-06 VITALS — BP 110/70 | HR 79 | Temp 98.1°F

## 2012-09-06 DIAGNOSIS — IMO0002 Reserved for concepts with insufficient information to code with codable children: Secondary | ICD-10-CM

## 2012-09-06 DIAGNOSIS — L02411 Cutaneous abscess of right axilla: Secondary | ICD-10-CM

## 2012-09-06 MED ORDER — SULFAMETHOXAZOLE-TRIMETHOPRIM 800-160 MG PO TABS: 1.0000 | ORAL_TABLET | Freq: Two times a day (BID) | ORAL | Status: DC

## 2012-09-06 MED ORDER — HYDROCODONE-ACETAMINOPHEN 10-325 MG PO TABS: 1.0000 | ORAL_TABLET | Freq: Three times a day (TID) | ORAL | Status: DC | PRN

## 2012-09-06 NOTE — Patient Instructions (Signed)
Abscess  Care After  An abscess (also called a boil or furuncle) is an infected area that contains a collection of pus. Signs and symptoms of an abscess include pain, tenderness, redness, or hardness, or you may feel a moveable soft area under your skin. An abscess can occur anywhere in the body. The infection may spread to surrounding tissues causing cellulitis. A cut (incision) by the surgeon was made over your abscess and the pus was drained out. Gauze may have been packed into the space to provide a drain that will allow the cavity to heal from the inside outwards. The boil may be painful for 5 to 7 days. Most people with a boil do not have high fevers. Your abscess, if seen early, may not have localized, and may not have been lanced. If not, another appointment may be required for this if it does not get better on its own or with medications.  HOME CARE INSTRUCTIONS   · Only take over-the-counter or prescription medicines for pain, discomfort, or fever as directed by your caregiver.  · When you bathe, soak and then remove gauze or iodoform packs at least daily or as directed by your caregiver. You may then wash the wound gently with mild soapy water. Repack with gauze or do as your caregiver directs.  SEEK IMMEDIATE MEDICAL CARE IF:   · You develop increased pain, swelling, redness, drainage, or bleeding in the wound site.  · You develop signs of generalized infection including muscle aches, chills, fever, or a general ill feeling.  · An oral temperature above 102° F (38.9° C) develops, not controlled by medication.  See your caregiver for a recheck if you develop any of the symptoms described above. If medications (antibiotics) were prescribed, take them as directed.  Document Released: 11/24/2004 Document Revised: 07/31/2011 Document Reviewed: 07/22/2007  [employer]® Patient Information ©2013 [employer].

## 2012-09-09 ENCOUNTER — Encounter: Payer: Self-pay | Admitting: Internal Medicine

## 2012-09-09 NOTE — Progress Notes (Signed)
Subjective:    Patient ID: Jean Blevins, female    DOB: 12/31/1960, 52 y.o.   MRN: [id number]  HPI  Pt presents to the clinic today with c/o increasing size and pain of cyst under right arm. The cyst has increased to the size of an egg despite being on the antibiotics for 3 days. She is also having trouble even putting her arm down. She thinks it may need to be lanced. She denies fever ,chills or body aches.   Review of Systems      Past Medical History  Diagnosis Date  . Depression   . Asthma   . Allergic rhinitis     Current Outpatient Prescriptions  Medication Sig Dispense Refill  . albuterol (PROAIR HFA) 108 (90 BASE) MCG/ACT inhaler Inhale 2 puffs into the lungs every 6 (six) hours as needed for wheezing.  8.5 each  5  . cyclobenzaprine (FLEXERIL) 5 MG tablet Take 1 tablet (5 mg total) by mouth 3 (three) times daily as needed for muscle spasms.  30 tablet  1  . montelukast (SINGULAIR) 10 MG tablet Take 1 tablet (10 mg total) by mouth at bedtime.  30 tablet  11  . sulfamethoxazole-trimethoprim (BACTRIM DS,SEPTRA DS) 800-160 MG per tablet Take 1 tablet by mouth 2 (two) times daily.  14 tablet  0  . buPROPion (WELLBUTRIN XL) 150 MG 24 hr tablet TAKE 1 TABLET EVERY MORNING  90 tablet  0  . HYDROcodone-acetaminophen (NORCO) 10-325 MG per tablet Take 1 tablet by mouth every 8 (eight) hours as needed for pain.  30 tablet  0  . mometasone (ASMANEX 30 METERED DOSES) 220 MCG/INH inhaler Inhale 2 puffs into the lungs daily.  1 Inhaler  12   No current facility-administered medications for this visit.    No Known Allergies  History reviewed. No pertinent family history.  History   Social History  . Marital Status: Divorced    Spouse Name: N/A    Number of Children: N/A  . Years of Education: N/A   Occupational History  . Not on file.   Social History Main Topics  . Smoking status: Never Smoker   . Smokeless tobacco: Not on file  . Alcohol Use: Not on file  . Drug Use:  Not on file  . Sexually Active: Not on file   Other Topics Concern  . Not on file   Social History Narrative  . No narrative on file     Constitutional: Denies fever, malaise, fatigue, headache or abrupt weight changes. .  Skin: Pt reports large abscess under right arm. Denies rashes, lesions or ulcercations.    No other specific complaints in a complete review of systems (except as listed in HPI above).  Objective:   Physical Exam   BP 110/70  Pulse 79  Temp(Src) 98.1 F (36.7 C) (Oral)  SpO2 96% Wt Readings from Last 3 Encounters:  09/03/12 169 lb (76.658 kg)  04/23/12 169 lb (76.658 kg)  02/27/12 169 lb (76.658 kg)    General: Appears her stated age, well developed, well nourished in NAD. Skin: Warm, dry and intact. Egg sized abscess under right axilla. Fluctuant surrounded by cellulitis. Cardiovascular: Normal rate and rhythm. S1,S2 noted.  No murmur, rubs or gallops noted. No JVD or BLE edema. No carotid bruits noted. Pulmonary/Chest: Normal effort and positive vesicular breath sounds. No respiratory distress. No wheezes, rales or ronchi noted.        Assessment & Plan:  Procedure Note:  I and D  of cyst  The patient elects to proceed after informed consent is obtained. The patient was informed of possible risks and complications prior to procedure. Using sterile technique throughout, patient skin is cleaned with betadine and alcohol. Cyst injected with lidocaine with epi. Small incision was made in the center of the cyst. Large amount of foul green exudate was expressed from the cyst. The incision did not need packing. The area was dressed with triple antibiotic ointment and a bandage. After care instructions given. The patient tolerated the procedure well.   Abscess of right axilla, infected:  I and D of cyst performed

## 2012-09-13 ENCOUNTER — Telehealth: Payer: Self-pay | Admitting: *Deleted

## 2012-09-13 MED ORDER — FLUCONAZOLE 150 MG PO TABS: 150.0000 mg | ORAL_TABLET | Freq: Once | ORAL | Status: DC

## 2012-09-13 NOTE — Telephone Encounter (Signed)
Ok for Diflucan 150 mg # 1, no refills

## 2012-09-13 NOTE — Telephone Encounter (Signed)
Phone call to pt letting her know Diflucan 150 mg is being routed to [facility] per her request

## 2012-09-13 NOTE — Telephone Encounter (Signed)
Left msg on triage stating saw Jean Blevins last wee was given antibiotic. She has completed med, but now she has develop a yeast infection. Requesting Jean Blevins to rx something for yeast infection...Raechel Chute

## 2012-11-26 ENCOUNTER — Encounter: Payer: Self-pay | Admitting: Internal Medicine

## 2012-11-26 ENCOUNTER — Ambulatory Visit (INDEPENDENT_AMBULATORY_CARE_PROVIDER_SITE_OTHER): Payer: Self-pay | Admitting: Internal Medicine

## 2012-11-26 VITALS — BP 110/74 | HR 94 | Temp 98.3°F | Wt 169.1 lb

## 2012-11-26 DIAGNOSIS — J209 Acute bronchitis, unspecified: Secondary | ICD-10-CM

## 2012-11-26 MED ORDER — HYDROCODONE-HOMATROPINE 5-1.5 MG/5ML PO SYRP: 5.0000 mL | ORAL_SOLUTION | Freq: Four times a day (QID) | ORAL | Status: DC | PRN

## 2012-11-26 MED ORDER — MONTELUKAST SODIUM 10 MG PO TABS: 10.0000 mg | ORAL_TABLET | Freq: Every day | ORAL | Status: DC

## 2012-11-26 MED ORDER — PREDNISONE (PAK) 10 MG PO TABS: 10.0000 mg | ORAL_TABLET | ORAL | Status: DC

## 2012-11-26 MED ORDER — METHYLPREDNISOLONE ACETATE 80 MG/ML IJ SUSP
80.0000 mg | Freq: Once | INTRAMUSCULAR | Status: AC
  Administered 2012-11-26: 80 mg via INTRAMUSCULAR

## 2012-11-26 NOTE — Progress Notes (Signed)
  Subjective:    Patient ID: Jean Blevins, female    DOB: February 21, 1961, 52 y.o.   MRN: [id number]  HPI  complains of harsh cough, ?flu Associated with increase in asthma symptoms and increased use of albuterol rescue inhaler Onset of symptoms 10 days ago Initially associated with low-grade fever and myalgia which have improved Now persisting dry cough with sore throat Minimal relief with homeopathic and over-the-counter medications  Past Medical History  Diagnosis Date  . Depression   . Asthma   . Allergic rhinitis     Review of Systems  Constitutional: Positive for fatigue. Negative for fever.  HENT: Positive for sore throat and postnasal drip. Negative for rhinorrhea, sneezing and trouble swallowing.   Respiratory: Positive for cough, chest tightness and wheezing. Negative for shortness of breath.   Cardiovascular: Negative for chest pain, palpitations and leg swelling.       Objective:   Physical Exam BP 110/74  Pulse 94  Temp(Src) 98.3 F (36.8 C) (Oral)  Wt 169 lb 1.9 oz (76.712 kg)  BMI 29.97 kg/m2  SpO2 97% Wt Readings from Last 3 Encounters:  11/26/12 169 lb 1.9 oz (76.712 kg)  09/03/12 169 lb (76.658 kg)  04/23/12 169 lb (76.658 kg)   Constitutional: She appears well-developed and well-nourished. Fatigued, dry deep cough spasms.  HENT: Head: Normocephalic and atraumatic. No sinus tenderness Ears: B TMs ok, no erythema or effusion; Nose: Nose normal. Mouth/Throat: Oropharynx is red, but clear and moist. No oropharyngeal exudate.  Eyes: Conjunctivae and EOM are normal. Pupils are equal, round, and reactive to light. No scleral icterus.  Neck: Normal range of motion. Neck supple. No JVD or LAD present. No thyromegaly present.  Cardiovascular: Normal rate, regular rhythm and normal heart sounds.  No murmur heard. No BLE edema. Pulmonary/Chest: mod increased effort due to cough spasm and diminished BS at bases. She has soft end exp wheezes. No crackles  Psychiatric:  She has a normal mood and affect. Her behavior is normal. Judgment and thought content normal.   Lab Results  Component Value Date   WBC 8.7 08/12/2009   HGB 13.1 08/12/2009   HCT 38.0 08/12/2009   PLT 240.0 08/12/2009   GLUCOSE 95 08/12/2009   ALT 23 02/20/2007   AST 25 02/20/2007   NA 142 08/12/2009   K 4.0 08/12/2009   CL 102 08/12/2009   CREATININE 0.7 08/12/2009   BUN 6 08/12/2009   CO2 30 08/12/2009   TSH 2.36 08/12/2009    CXR 11/2006 - NAD     Assessment & Plan:   Acute asthmatic bronchitis - fewer than one flare per year Current symptoms triggered by viral infection from sick contact  IM Medrol 80 mg today Prescribe prednisone taper x6 days and Hydromet cough suppression Renew singular to take daily, but hold inhaled steroid or LABA at this time due to infreq flares Also will hold empiric antibiotics unless worsening symptoms, purulent sputum or fever arise - afebrile and normal O2 sats, no crackles on exam Patient agrees to call symptoms worse or unimproved with prescribed therapy

## 2012-11-26 NOTE — Patient Instructions (Addendum)
It was good to see you today. If you develop worsening symptoms or fever, call and we can reconsider antibiotics, but it does not appear necessary to use antibiotics at this time. Steroid shot given today for cough and asthma flare -Medrol 80 mg in office today Treat asthma flare with prednisone taper over the next 6 days and use Hydromet syrup as needed for cough, especially at night Your prescription(s) have been submitted to your pharmacy. Please take as directed and contact our office if you believe you are having problem(s) with the medication(s). Call if symptoms unimproved in next 5-7 days, sooner if worseAsthma, Acute Bronchospasm Your exam shows you have asthma, or acute bronchospasm that acts like asthma. Bronchospasm means your air passages become narrowed. These conditions are due to inflammation and airway spasm that cause narrowing of the bronchial tubes in the lungs. This causes you to have wheezing and shortness of breath. POSSIBLE TRIGGERS  Animal dander from the skin, hair, or feathers of animals.  Dust mites contained in house dust.  Cockroaches.  Pollen from trees or grass.  Mold.  Cigarette or tobacco smoke.  Air pollutants such as dust, household cleaners, hair sprays, aerosol sprays, paint fumes, strong chemicals, or strong odors.  Cold air or weather changes. Cold air may cause inflammation. Winds increase molds and pollens in the air.  Strong emotions such as crying or laughing hard.  Stress.  Certain medicines such as aspirin or beta-blockers.  Sulfites in such foods and drinks as dried fruits and wine.  Infections or inflammatory conditions such as a flu, cold, or inflammation of the nasal membranes (rhinitis).  Gastroesophageal reflux disease (GERD). GERD is a condition where stomach acid backs up into your throat (esophagus).  Exercise or strenous activity. TREATMENT  Treatment is aimed at making the narrowed airways larger. Mild asthma or  bronchospasm is usually controlled with inhaled medicines. Albuterol is a common medicine that you breathe in to open spastic or narrowed airways. Steroid medicine is also used to reduce the inflammation when an attack is moderate or severe. Antibiotics are only used if a bacterial infection is present.  HOME CARE INSTRUCTIONS   Rest.  Drink plenty of liquids. This helps the mucus to remain thin and easily coughed up. Do not use caffeine or alcohol.  Do not smoke. Avoid being exposed to secondhand smoke.  You play a critical role in keeping yourself in good health. Avoid exposure to things that cause you to wheeze. Avoid exposure to things that cause you to have breathing problems. Keep your medicines up-to-date and available. Carefully follow your caregiver's treatment plan.  When pollen or pollution is bad, keep windows closed and use an air conditioner or go to places with air conditioning.  Take your medicine exactly as prescribed.  Asthma requires careful medical attention. See your caregiver for follow-up as advised. If you are more than [redacted] weeks pregnant and you were prescribed any new medicines, let your obstetrician know about the visit and how you are doing. Arrange a recheck. SEEK IMMEDIATE MEDICAL CARE IF:   You are getting worse.  You have trouble breathing. If severe, call your local emergency services 911 in [place]  You develop chest pain or discomfort.  You are throwing up or not drinking fluids.  You are coughing up yellow, green, brown, or bloody sputum.  You have a fever or persistent symptoms for more than 2 3 days.  You have a fever and your symptoms suddenly get worse.  You have  trouble swallowing. MAKE SURE YOU:   Understand these instructions.  Will watch your condition.  Will get help right away if you are not doing well or get worse. Document Released: 08/23/2006 Document Revised: 04/24/2012 Document Reviewed: 04/22/2007 [facility] Patient Information  2014 [place], [place].

## 2012-11-29 ENCOUNTER — Telehealth: Payer: Self-pay | Admitting: Internal Medicine

## 2012-11-29 MED ORDER — NYSTATIN 100000 UNIT/ML MT SUSP: 500000.0000 [IU] | Freq: Four times a day (QID) | OROMUCOSAL | Status: DC

## 2012-11-29 NOTE — Telephone Encounter (Signed)
Pt has been informed and will check with her pharmacy.

## 2012-11-29 NOTE — Telephone Encounter (Signed)
Pt is not better.  Still has the cough.  Can't talk for long.  Can't gargle very well.  Throat feels like it is on fire.  What else to do?

## 2012-11-29 NOTE — Telephone Encounter (Signed)
Use nystatin swish and swallow 4x/day for throat pain - erx done

## 2013-03-27 ENCOUNTER — Other Ambulatory Visit: Payer: Self-pay

## 2013-09-01 ENCOUNTER — Other Ambulatory Visit: Payer: Self-pay | Admitting: Internal Medicine

## 2013-10-16 ENCOUNTER — Telehealth: Payer: Self-pay | Admitting: Physician Assistant

## 2013-10-16 NOTE — Telephone Encounter (Signed)
Pt was dr Linda Hedges pt and would like to be est with dr Maudie Mercury. Can I sch?

## 2013-10-16 NOTE — Telephone Encounter (Signed)
Yes, in new patient slot. Thanks.

## 2013-10-17 ENCOUNTER — Ambulatory Visit (INDEPENDENT_AMBULATORY_CARE_PROVIDER_SITE_OTHER)
Admission: RE | Admit: 2013-10-17 | Discharge: 2013-10-17 | Disposition: A | Discharge status: Home / Self Care | Payer: 59 | Source: Ambulatory Visit | Attending: Internal Medicine | Admitting: Internal Medicine

## 2013-10-17 ENCOUNTER — Encounter: Payer: Self-pay | Admitting: Internal Medicine

## 2013-10-17 ENCOUNTER — Ambulatory Visit (INDEPENDENT_AMBULATORY_CARE_PROVIDER_SITE_OTHER): Payer: 59 | Admitting: Internal Medicine

## 2013-10-17 VITALS — BP 112/78 | HR 80 | Temp 98.3°F | Resp 15 | Wt 183.8 lb

## 2013-10-17 DIAGNOSIS — J45901 Unspecified asthma with (acute) exacerbation: Secondary | ICD-10-CM

## 2013-10-17 DIAGNOSIS — J310 Chronic rhinitis: Secondary | ICD-10-CM

## 2013-10-17 MED ORDER — BUDESONIDE-FORMOTEROL FUMARATE 160-4.5 MCG/ACT IN AERO: 2.0000 | INHALATION_SPRAY | Freq: Two times a day (BID) | RESPIRATORY_TRACT | Status: DC

## 2013-10-17 MED ORDER — AZITHROMYCIN 250 MG PO TABS: ORAL_TABLET | ORAL | Status: DC

## 2013-10-17 MED ORDER — PREDNISONE 20 MG PO TABS: 20.0000 mg | ORAL_TABLET | Freq: Two times a day (BID) | ORAL | Status: DC

## 2013-10-17 NOTE — Patient Instructions (Addendum)
  Albuterol is a rescue inhaler which should be used as infrequently as possible; it should never be used more than 1-2 puffs every 4 hours. It may  be used 15-30 minutes before exercise if that is also  a trigger for your asthma.   If it is required more than 2-3 times per week; the asthma is not well controlled. Symbicort , Dulera , Advair or other maintenance agent should be considered to control smooth muscle spasm and airway inflammation  and to prevent adverse effects from excess albuterol use.Those adverse effects can include health or life threatening heart rhythm irregularities. .  Symbicort  two inhalations every 12 hours; gargle and spit after use. Plain Mucinex (NOT D) for thick secretions ;force NON dairy fluids .   Nasal cleansing in the shower as discussed with lather of mild shampoo.After 10 seconds wash off lather while  exhaling through nostrils. Make sure that all residual soap is removed to prevent irritation.  Flonase OR Nasacort AQ 1 spray in each nostril twice a day as needed. Use the "crossover" technique into opposite nostril spraying toward opposite ear @ 45 degree angle, not straight up into nostril.  Use a Neti pot daily only  as needed for significant sinus congestion; going from open side to congested side . Plain Allegra (NOT D )  160 daily , Loratidine 10 mg , OR Zyrtec 10 mg @ bedtime  as needed for itchy eyes & sneezing. Fill the  prescription for antibiotic it there is not dramatic improvement in the next 48-72 hours.

## 2013-10-17 NOTE — Progress Notes (Signed)
   Subjective:    Patient ID: Jean Blevins, female    DOB: 1960/06/05, 53 y.o.   MRN: [id number]  HPI Respiratory symptoms began 10/11/13 in the context of renovating her kitchen with associated dust exposure. The cough began within an hour of initiating activity with shortness of breath and wheezing. She used the albuterol up to 5 times that day. She also started what was left of a prior prednisone Dosepak. There was improvement with oral steroids but the symptoms recurred in the last 48 hours.  She describes associated facial and frontal congestion and some postnasal drainage.  She is on no maintenance asthma medication. She is taking her generic Singulair irregularly, missing up to a week at a time.  She has been using a homeopathic nasal preparation.   Review of Systems  She denies facial pain, nasal purulence, sore throat, dental pain, otic pain, or otic discharge.  She has no fever, chills, or sweats  Cough is productive of the clear postnasal drainage secretions but no purulence.  Reflux is not significant process at this time.     Objective:   Physical Exam  General appearance:well nourished but weight excess; no  increased work of breathing is present.  No  lymphadenopathy about the head, neck, or axilla noted.   Eyes: No conjunctival inflammation or lid edema is present. There is no scleral icterus.  Ears:  External ear exam shows no significant lesions or deformities.  Otoscopic examination reveals clear canals, tympanic membranes are intact bilaterally without bulging, retraction, inflammation or discharge.  Nose:  External nasal examination shows no deformity or inflammation. Nasal mucosa are pink and moist without lesions or exudates. No septal dislocation or deviation.No obstruction to airflow.   Oral exam: Dental hygiene is good; lips and gums are healthy appearing.There is no oropharyngeal erythema or exudate noted.   Neck:  No deformities, thyromegaly, masses,  or tenderness noted.   Supple with full range of motion without pain.   Heart:  Normal rate and regular rhythm. S1 and S2 normal without gallop, murmur, click, rub or other extra sounds.   Lungs: She has severe paroxysms of cough which are racking.Initially rales were suggested in the bases. After repeated deep inspirations & coughing rales were suggested only in the left lower lobe..No increased work of breathing.    Extremities:  No cyanosis, edema, or clubbing  noted    Skin: Warm & dry w/o jaundice or tenting.         Assessment & Plan:  #1Asthma with acute exacerbation, most likely related to dust exposure. Rule out infectious component because of rales on auscultation  #2 rhinitis  #3 beta agonists abuse  Plan: See orders and recommendations. The risk associated with excess use of rescue inhaler was discussed in detail.

## 2013-10-17 NOTE — Progress Notes (Signed)
Pre visit review using our clinic review tool, if applicable. No additional management support is needed unless otherwise documented below in the visit note. 

## 2013-10-17 NOTE — Telephone Encounter (Signed)
Pt has been sch

## 2013-10-17 NOTE — Progress Notes (Signed)
Subjective:    Patient ID: Jean Blevins, female    DOB: 09-17-1960, 53 y.o.   MRN: [id number]  HPI cough  Pt reports cough began on 10/11/13.  She was refurbishing her kitchen and reports that within an hour of activity she felt she was having an asthma attack and began coughing. She was SOB and wheezing. She used her albuterol inhaler approximately 5x that day and had a leftover prednisone pack that she started taking on Sunday. The SOB and wheezing subsided with the combination of medications but the cough has not improved. This morning was her last day of prednisone. The cough is non productive and exacerbated by talking and moving. The pt believes her asthma has worsened due to environmental allergies over the years. Prior to this attack she states she was not needing her inhaler but maybe once a month.   Review of Systems  Constitutional: Negative for fever, chills and fatigue.  HENT: Positive for congestion, postnasal drip and sinus pressure. Negative for ear discharge, ear pain, rhinorrhea and sneezing.        Otic fullness   Eyes: Negative for itching.  Respiratory: Positive for shortness of breath. Negative for wheezing.        Chest and back feel generally sore from the coughing exacerbations   Cardiovascular: Negative for chest pain and palpitations.  Neurological: Positive for headaches.       Sinus headaches. Pt is taking homeopathic Sinusaila        Objective:   Physical Exam Gen.: Healthy and well-nourished in appearance. Alert, appropriate and cooperative throughout exam. Head: Normocephalic without obvious abnormalities. Pain upon palpation of frontal sinuses.  Eyes: No corneal or conjunctival inflammation noted. Pupils equal round reactive to light and accommodation. Extraocular motion intact. Ears: External  ear exam reveals no significant lesions or deformities. Canals clear .TMs normal. Hearing is grossly normal bilaterally. Nose: External nasal exam reveals no  deformity or inflammation. Nasal mucosa are pink and moist. R turbinate is erythematous and swollen.   Mouth: Oral mucosa and oropharynx reveal no lesions or exudates. Teeth in good repair. Lungs: Normal respiratory effort; chest expands symmetrically. Rales noted in . There is increased work of breathing. Pt is unable to take a deep breath without experiencing a coughing exacerbation.  Heart: Normal rate and rhythm. Normal S1 and S2. No gallop, click, or rub. No murmur. No clubbing, cyanosis, edema, or significant extremity  deformity noted. Range of motion normal .Tone & strength normal. Hand joints normal Able to lie down & sit up w/o help.  Vascular: Carotid, radial artery, dorsalis pedis full and equal. Neurologic: Alert and oriented x3    Skin: Intact without suspicious lesions or rashes. Lymph: No cervical, axillary, lymphadenopathy present. Psych: Mood and affect are normal. Normally interactive                                                                                 Assessment & Plan:  #1 asthma exacerbation; most likely due to environmental factors #2 acute bronchitis   Will Rx prednison pack, symbicort, and encourage nasal hygiene with flonase or nasacort as pt has allergic component. Discussed safe frequency of albuterol use with  pt. Will obtain CXR to eval for PNA.

## 2013-10-23 ENCOUNTER — Telehealth: Payer: Self-pay | Admitting: Internal Medicine

## 2013-10-23 NOTE — Telephone Encounter (Signed)
Pt called stated Dr. Linna Darner gave her prednisone to take but she want to stop this med because every time it, pt stated that its make her feel           " bonkers". Please advise.

## 2013-10-23 NOTE — Telephone Encounter (Signed)
   Unfortunately the prednisone is the best treatment to resolve acute asthma. She needs to understand this if she does stop the prednisone. She may try taking half recommended dose  I am a board-certified pulmonologist and do not dispense prednisone without considering the benefits and risks.

## 2013-10-23 NOTE — Telephone Encounter (Signed)
Spoke with pt advised of MDs message 

## 2013-11-20 ENCOUNTER — Ambulatory Visit: Payer: Self-pay | Admitting: Family Medicine

## 2013-12-06 ENCOUNTER — Other Ambulatory Visit: Payer: Self-pay | Admitting: Internal Medicine

## 2013-12-23 ENCOUNTER — Encounter: Payer: Self-pay | Admitting: Family Medicine

## 2013-12-23 ENCOUNTER — Ambulatory Visit (INDEPENDENT_AMBULATORY_CARE_PROVIDER_SITE_OTHER): Payer: 59 | Admitting: Family Medicine

## 2013-12-23 VITALS — BP 100/70 | HR 67 | Temp 98.8°F | Ht 63.0 in | Wt 186.5 lb

## 2013-12-23 DIAGNOSIS — J309 Allergic rhinitis, unspecified: Secondary | ICD-10-CM

## 2013-12-23 DIAGNOSIS — J45909 Unspecified asthma, uncomplicated: Secondary | ICD-10-CM

## 2013-12-23 DIAGNOSIS — Z1211 Encounter for screening for malignant neoplasm of colon: Secondary | ICD-10-CM

## 2013-12-23 MED ORDER — MONTELUKAST SODIUM 10 MG PO TABS: 10.0000 mg | ORAL_TABLET | Freq: Every day | ORAL | Status: DC

## 2013-12-23 NOTE — Progress Notes (Addendum)
No chief complaint on file.   HPI:  Alyissa Whidbee is here to establish care. Used to see Dr. Linda Hedges. Last PCP and physical: has been several years  Has the following chronic problems and concerns today:  Patient Active Problem List   Diagnosis Date Noted  . DEPRESSION, SITUATIONAL 10/24/2007  . ALLERGIC RHINITIS 12/15/2006  . ASTHMA 12/15/2006   Allergic Rhinitis/Asthma: -has nasal symptoms and take singulair for this -had asthma attack in June, but had not had attack in the past for over 1 year -saw allergist in the past and was supposed to continue shots, but ended up not doing this -not taking INS or antihistamine -reports has tried flonase and did not like it and felt like made ears feel worse -wants to see allergist -uses homeopathic treatments and they work well for her  ROS negative for unless reported above: fevers, unintentional weight loss, hearing or vision loss, chest pain, palpitations, struggling to breath, hemoptysis, melena, hematochezia, hematuria, falls, loc, si, thoughts of self harm  Past Medical History  Diagnosis Date  . Depression   . Asthma   . Allergic rhinitis   . Palpitations 03/17/2010    Qualifier: Diagnosis of  By: Linda Hedges MD, Heinz Knuckles     Family History  Problem Relation Age of Onset  . Cancer Mother     breast  . Diabetes Mother   . Heart disease Father     MI in his 25s  . Hypertension Father   . Hyperlipidemia Father   . Diabetes Maternal Grandmother     History   Social History  . Marital Status: Divorced    Spouse Name: N/A    Number of Children: N/A  . Years of Education: N/A   Social History Main Topics  . Smoking status: Never Smoker   . Smokeless tobacco: None  . Alcohol Use: No  . Drug Use: None  . Sexual Activity: None   Other Topics Concern  . None   Social History Narrative   Work or School: medical records      Home Situation: lives with mother      Spiritual Beliefs: beliefs yes, no church       Lifestyle: no regular exercise; diet is so so             Current outpatient prescriptions:montelukast (SINGULAIR) 10 MG tablet, Take 1 tablet (10 mg total) by mouth at bedtime., Disp: 30 tablet, Rfl: 3;  VENTOLIN HFA 108 (90 BASE) MCG/ACT inhaler, USE 2 PUFFS EVERY 6 HOURS AS NEEDED FOR WHEEZING., Disp: 18 g, Rfl: 0  EXAM:  Filed Vitals:   12/23/13 1615  BP: 100/70  Pulse: 67  Temp: 98.8 F (37.1 C)    Body mass index is 33.05 kg/(m^2).  GENERAL: vitals reviewed and listed above, alert, oriented, appears well hydrated and in no acute distress  HEENT: atraumatic, conjunttiva clear, no obvious abnormalities on inspection of external nose and ears, normal appearance of ear canals and TMs, clear nasal congestion, mild post oropharyngeal erythema with PND, no tonsillar edema or exudate, no sinus TTP  NECK: no obvious masses on inspection  LUNGS: clear to auscultation bilaterally, no wheezes, rales or rhonchi, good air movement  CV: HRRR, no peripheral edema  MS: moves all extremities without noticeable abnormality  PSYCH: pleasant and cooperative, no obvious depression or anxiety  ASSESSMENT AND PLAN:  Discussed the following assessment and plan:  ALLERGIC RHINITIS - Plan: montelukast (SINGULAIR) 10 MG tablet  ASTHMA - Plan: montelukast (SINGULAIR)  10 MG tablet  Colon cancer screening - Plan: Ambulatory referral to Gastroenterology  -We reviewed the PMH, PSH, FH, SH, Meds and Allergies. -We provided refills for any medications we will prescribe as needed. -We addressed current concerns per orders and patient instructions. -We have asked for records for pertinent exams, studies, vaccines and notes from previous providers. -We have advised patient to follow up per instructions below. -she is to schedule CPE with labs as she leaves today  -Patient advised to return or notify a doctor immediately if symptoms worsen or persist or new concerns arise.  Patient Instructions   -start claritin or zyrtec daily  -call allergist and asthma office for appointment  -call to schedule mammogram  -We placed a referral for you as discussed for the colonoscopy. It usually takes about 1-2 weeks to process and schedule this referral. If you have not heard from [place] regarding this appointment in 2 weeks please contact our office.  -schedule your CPE in the next few months - come fasting, drink plenty of water      KIM, HANNAH R.

## 2013-12-23 NOTE — Patient Instructions (Addendum)
-  start claritin or zyrtec daily  -call allergist and asthma office for appointment  -call to schedule mammogram  -We placed a referral for you as discussed for the colonoscopy. It usually takes about 1-2 weeks to process and schedule this referral. If you have not heard from [place] regarding this appointment in 2 weeks please contact our office.  -schedule your CPE in the next few months - come fasting, drink plenty of water

## 2013-12-23 NOTE — Progress Notes (Signed)
Pre visit review using our clinic review tool, if applicable. No additional management support is needed unless otherwise documented below in the visit note. 

## 2014-01-27 ENCOUNTER — Telehealth: Payer: Self-pay | Admitting: Family Medicine

## 2014-01-27 NOTE — Telephone Encounter (Signed)
Spoke with patient in regards. 

## 2014-01-27 NOTE — Telephone Encounter (Signed)
Ok with me 

## 2014-01-27 NOTE — Telephone Encounter (Signed)
Patient was previous [facility] patient.  She established care with DR. Kim at [facility].  She does have an appt with Dr. Doug Sou to establish care tomorrow. She is requesting to be able to keep this appt. And to continue on at [facility] b/c of convince.  Please advise.

## 2014-01-29 ENCOUNTER — Ambulatory Visit (INDEPENDENT_AMBULATORY_CARE_PROVIDER_SITE_OTHER): Payer: 59 | Admitting: Internal Medicine

## 2014-01-29 ENCOUNTER — Encounter: Payer: Self-pay | Admitting: Internal Medicine

## 2014-01-29 VITALS — BP 126/84 | HR 89 | Temp 98.3°F | Resp 18 | Ht 63.0 in | Wt 190.4 lb

## 2014-01-29 DIAGNOSIS — E669 Obesity, unspecified: Secondary | ICD-10-CM

## 2014-01-29 DIAGNOSIS — J45909 Unspecified asthma, uncomplicated: Secondary | ICD-10-CM

## 2014-01-29 DIAGNOSIS — Z23 Encounter for immunization: Secondary | ICD-10-CM

## 2014-01-29 DIAGNOSIS — J309 Allergic rhinitis, unspecified: Secondary | ICD-10-CM

## 2014-01-29 DIAGNOSIS — F4321 Adjustment disorder with depressed mood: Secondary | ICD-10-CM

## 2014-01-29 NOTE — Progress Notes (Signed)
Pre visit review using our clinic review tool, if applicable. No additional management support is needed unless otherwise documented below in the visit note. 

## 2014-01-29 NOTE — Patient Instructions (Signed)
We want you to work on exercising about 3-4 days per week for about 30 minutes. Think about getting a mammogram and the colonoscopy when you have time.   If you are doing well come back in about 6-12 months. If you have breathing problems or are sick please feel free to come back sooner.   Exercise to Lose Weight Exercise and a healthy diet may help you lose weight. Your doctor may suggest specific exercises. EXERCISE IDEAS AND TIPS  Choose low-cost things you enjoy doing, such as walking, bicycling, or exercising to workout videos.  Take stairs instead of the elevator.  Walk during your lunch break.  Park your car further away from work or school.  Go to a gym or an exercise class.  Start with 5 to 10 minutes of exercise each day. Build up to 30 minutes of exercise 4 to 6 days a week.  Wear shoes with good support and comfortable clothes.  Stretch before and after working out.  Work out until you breathe harder and your heart beats faster.  Drink extra water when you exercise.  Do not do so much that you hurt yourself, feel dizzy, or get very short of breath. Exercises that burn about 150 calories:  Running 1  miles in 15 minutes.  Playing volleyball for 45 to 60 minutes.  Washing and waxing a car for 45 to 60 minutes.  Playing touch football for 45 minutes.  Walking 1  miles in 35 minutes.  Pushing a stroller 1  miles in 30 minutes.  Playing basketball for 30 minutes.  Raking leaves for 30 minutes.  Bicycling 5 miles in 30 minutes.  Walking 2 miles in 30 minutes.  Dancing for 30 minutes.  Shoveling snow for 15 minutes.  Swimming laps for 20 minutes.  Walking up stairs for 15 minutes.  Bicycling 4 miles in 15 minutes.  Gardening for 30 to 45 minutes.  Jumping rope for 15 minutes.  Washing windows or floors for 45 to 60 minutes. Document Released: 06/10/2010 Document Revised: 07/31/2011 Document Reviewed: 06/10/2010 [facility] Patient Information  2015 [place], [place]. This information is not intended to replace advice given to you by your health care provider. Make sure you discuss any questions you have with your health care provider.

## 2014-01-30 DIAGNOSIS — E669 Obesity, unspecified: Secondary | ICD-10-CM | POA: Insufficient documentation

## 2014-01-30 NOTE — Assessment & Plan Note (Signed)
Discussed with patient that she needs to start exercising and work on better diet to help her health.

## 2014-01-30 NOTE — Assessment & Plan Note (Signed)
Not needing SABA except 1-2 times per month, no night time awakenings, no controller medicine needed. She did agree to flu shot today.

## 2014-01-30 NOTE — Assessment & Plan Note (Signed)
Patient is taking OTC allergy medication during her allergy season. She does not think she is taking montelukast but she will check at home to make sure. No exacerbation at this time.

## 2014-01-30 NOTE — Assessment & Plan Note (Signed)
Overall mood is stable and she is trying to take time for herself.

## 2014-01-30 NOTE — Progress Notes (Signed)
   Subjective:    Patient ID: Jean Blevins, female    DOB: 06-14-1960, 53 y.o.   MRN: [id number]  HPI The patient is a 53 YO female who is coming in today to establish care. She has PMH of asthma and allergies. She is doing well at this time. 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 She does not use her rescue inhaler except for situational incidents involving deep cleaning or cleaning under beds. She is taking OTC allergy medicine as this is an allergy season for her. She is not exercising much at all right now. She is having some twitching of her eyelid.    Review of Systems  Constitutional: Negative for fever, chills, activity change and fatigue.  HENT: Negative for congestion, postnasal drip, rhinorrhea, sinus pressure and sore throat.   Respiratory: Negative for cough, chest tightness, shortness of breath and wheezing.   Cardiovascular: Negative for chest pain, palpitations and leg swelling.  Gastrointestinal: Negative for vomiting, abdominal pain, diarrhea, constipation and abdominal distention.  Endocrine: Negative.   Musculoskeletal: Negative for arthralgias and gait problem.  Skin: Negative for color change and wound.  Allergic/Immunologic: Positive for environmental allergies.  Neurological: Negative for dizziness, weakness, light-headedness and headaches.      Objective:   Physical Exam  Constitutional: She is oriented to person, place, and time. She appears well-developed and well-nourished.  Overweight  HENT:  Head: Normocephalic and atraumatic.  Eyes: EOM are normal.  Neck: Normal range of motion. No JVD present.  Cardiovascular: Normal rate and regular rhythm.   No murmur heard. Pulmonary/Chest: Effort normal and breath sounds normal. No respiratory distress. She has no wheezes. She has no rales. She exhibits no  tenderness.  Abdominal: Soft. Bowel sounds are normal. She exhibits no distension. There is no tenderness. There is no rebound.  Musculoskeletal: She exhibits no tenderness.  Neurological: She is alert and oriented to person, place, and time. No cranial nerve deficit. Coordination normal.  Skin: Skin is warm and dry.   Filed Vitals:   01/29/14 1607  BP: 126/84  Pulse: 89  Temp: 98.3 F (36.8 C)  TempSrc: Oral  Resp: 18  Height: 5\' 3"  (1.6 m)  Weight: 190 lb 6.4 oz (86.365 kg)  SpO2: 97%      Assessment & Plan:  She agrees to have flu shot today.

## 2014-02-13 ENCOUNTER — Telehealth: Payer: Self-pay | Admitting: *Deleted

## 2014-02-13 DIAGNOSIS — J4520 Mild intermittent asthma, uncomplicated: Secondary | ICD-10-CM

## 2014-02-13 NOTE — Telephone Encounter (Signed)
Called pt back no answer LMOM md has enter lipid & Basic metabolic panel...Jean Blevins

## 2014-02-13 NOTE — Telephone Encounter (Signed)
Yes, will put in labs.

## 2014-02-13 NOTE — Telephone Encounter (Signed)
Left msg on triage stating saw Dr. Doug Sou on 01/29/14 she did not do labs. Pt is wanting to come in to have labs done. Is it ok to enter cpax labs...Jean Blevins

## 2014-02-23 ENCOUNTER — Encounter: Payer: Self-pay | Admitting: Family Medicine

## 2014-02-23 ENCOUNTER — Other Ambulatory Visit (INDEPENDENT_AMBULATORY_CARE_PROVIDER_SITE_OTHER): Payer: 59

## 2014-02-23 DIAGNOSIS — J4520 Mild intermittent asthma, uncomplicated: Secondary | ICD-10-CM

## 2014-02-23 LAB — LIPID PANEL
Cholesterol: 183 mg/dL (ref 0–200)
HDL: 41.8 mg/dL (ref >39.00)
LDL Cholesterol: 117 mg/dL — ABNORMAL HIGH (ref 0–99)
NonHDL: 141.2
Total CHOL/HDL Ratio: 4
Triglycerides: 119 mg/dL (ref 0.0–149.0)
VLDL: 23.8 mg/dL (ref 0.0–40.0)

## 2014-02-23 LAB — BASIC METABOLIC PANEL
BUN: 10 mg/dL (ref 6–23)
CALCIUM: 9.2 mg/dL (ref 8.4–10.5)
CO2: 26 meq/L (ref 19–32)
CREATININE: 0.7 mg/dL (ref 0.4–1.2)
Chloride: 105 mEq/L (ref 96–112)
GFR: 99.59 mL/min (ref >60.00)
GLUCOSE: 117 mg/dL — AB (ref 70–99)
Potassium: 3.7 mEq/L (ref 3.5–5.1)
SODIUM: 137 meq/L (ref 135–145)

## 2014-03-04 ENCOUNTER — Encounter: Payer: Self-pay | Admitting: Family Medicine

## 2014-03-23 ENCOUNTER — Ambulatory Visit (INDEPENDENT_AMBULATORY_CARE_PROVIDER_SITE_OTHER): Payer: 59 | Admitting: Family

## 2014-03-23 ENCOUNTER — Encounter: Payer: Self-pay | Admitting: Family

## 2014-03-23 VITALS — BP 110/72 | HR 78 | Temp 98.3°F | Resp 18 | Ht 63.0 in | Wt 191.0 lb

## 2014-03-23 DIAGNOSIS — H01006 Unspecified blepharitis left eye, unspecified eyelid: Secondary | ICD-10-CM | POA: Insufficient documentation

## 2014-03-23 MED ORDER — ERYTHROMYCIN 5 MG/GM OP OINT: 1.0000 "application " | TOPICAL_OINTMENT | Freq: Four times a day (QID) | OPHTHALMIC | Status: DC

## 2014-03-23 NOTE — Progress Notes (Signed)
   Subjective:    Patient ID: Jean Blevins, female    DOB: 1961-01-24, 53 y.o.   MRN: [id number]  Chief Complaint  Patient presents with  . Eye swelling    left eye has been swollen, red, and irritated x3 day, has a place on her left leg that will not go away since june    HPI:  Jean Blevins is a 53 y.o. female who presents today for eye swelling.  1) Eye swelling - started on Saturday - feeling warm and hurts. Gets blurry at times.  Describes crustiness and discharge in the morning. Eyelid has been hard to open in the morning.  Denies any attempted treatments. Denies anything that makes it better or worse.   2) Spot on Left Thigh - thought it was a bug bite and has been there since last July and has not gone away. Denies any change to it. Denies any discharge. Denies any attempted treatments. Denies anything that makes it better or worse.   No Known Allergies  Current Outpatient Prescriptions on File Prior to Visit  Medication Sig Dispense Refill  . montelukast (SINGULAIR) 10 MG tablet Take 1 tablet (10 mg total) by mouth at bedtime. 30 tablet 3  . VENTOLIN HFA 108 (90 BASE) MCG/ACT inhaler USE 2 PUFFS EVERY 6 HOURS AS NEEDED FOR WHEEZING. 18 g 0   No current facility-administered medications on file prior to visit.   Review of Systems    See HPI  Objective:    BP 110/72 mmHg  Pulse 78  Temp(Src) 98.3 F (36.8 C) (Oral)  Resp 18  Ht 5\' 3"  (1.6 m)  Wt 191 lb (86.637 kg)  BMI 33.84 kg/m2  SpO2 97% Nursing note and vital signs reviewed.  Physical Exam  Constitutional: She is oriented to person, place, and time. She appears well-developed and well-nourished. No distress.  Eyes:  Edema noted left upper eye with a small stye present. No conjunctival redness noted. Vision is intact and appropriate.   Cardiovascular: Normal rate, regular rhythm, normal heart sounds and intact distal pulses.   Pulmonary/Chest: Effort normal and breath sounds normal.  Neurological: She is  alert and oriented to person, place, and time.  Skin: Skin is warm and dry.  Macule with no discharge present on left thigh.   Psychiatric: She has a normal mood and affect. Her behavior is normal. Judgment and thought content normal.       Assessment & Plan:

## 2014-03-23 NOTE — Assessment & Plan Note (Signed)
Symptoms and exam consistent with blepharitis. Start erythromycin opthalmic ointment QID x 7 days. Instructed to wash eye and keep it clean. May use warm compress as needed. Instructed to follow up if symptoms worsen or fail to improve, or there is changes to vision.

## 2014-03-23 NOTE — Patient Instructions (Signed)
Thank you for choosing [facility].  Summary/Instructions:   Please use the antibiotic ointment for 5 days or up to 7  If your symptoms worsen or fail to improve please let us know.  Keep your eye clean with baby soap and water.   Blepharitis Blepharitis is redness, soreness, and swelling (inflammation) of one or both eyelids. It may be caused by an allergic reaction or a bacterial infection. Blepharitis may also be associated with reddened, scaly skin (seborrhea) of the scalp and eyebrows. While you sleep, eye discharge may cause your eyelashes to stick together. Your eyelids may itch, burn, swell, and may lose their lashes. These will grow back. Your eyes may become sensitive. Blepharitis may recur and need repeated treatment. If this is the case, you may require further evaluation by an eye specialist (ophthalmologist). HOME CARE INSTRUCTIONS   Keep your hands clean.  Use a clean towel each time you dry your eyelids. Do not use this towel to clean other areas. Do not share a towel or makeup with anyone.  Wash your eyelids with warm water or warm water mixed with a small amount of baby shampoo. Do this twice a day or as often as needed.  Wash your face and eyebrows at least once a day.  Use warm compresses 2 times a day for 10 minutes at a time, or as directed by your caregiver.  Apply antibiotic ointment as directed by your caregiver.  Avoid rubbing your eyes.  Avoid wearing makeup until you get better.  Follow up with your caregiver as directed. SEEK IMMEDIATE MEDICAL CARE IF:   You have pain, redness, or swelling that gets worse or spreads to other parts of your face.  Your vision changes, or you have pain when looking at lights or moving objects.  You have a fever.  Your symptoms continue for longer than 2 to 4 days or become worse. MAKE SURE YOU:   Understand these instructions.  Will watch your condition.  Will get help right away if you are not doing  well or get worse. Document Released: 05/05/2000 Document Revised: 07/31/2011 Document Reviewed: 06/15/2010 [facility] Patient Information 2015 [place], [place]. This information is not intended to replace advice given to you by your health care provider. Make sure you discuss any questions you have with your health care provider.

## 2014-03-23 NOTE — Progress Notes (Signed)
Pre visit review using our clinic review tool, if applicable. No additional management support is needed unless otherwise documented below in the visit note. 

## 2014-04-01 ENCOUNTER — Telehealth: Payer: Self-pay | Admitting: Family

## 2014-04-01 NOTE — Telephone Encounter (Signed)
Pt called in eye isnt getting any better.  She called in asking for a oral antibiotic

## 2014-04-02 MED ORDER — AZITHROMYCIN 250 MG PO TABS: ORAL_TABLET | ORAL | Status: DC

## 2014-04-02 NOTE — Telephone Encounter (Signed)
Prescription sent to the pharmacy.

## 2014-04-03 NOTE — Telephone Encounter (Signed)
Called pt no answer LMOM rx sent to [facility]...Jean Blevins

## 2014-05-12 ENCOUNTER — Other Ambulatory Visit: Payer: Self-pay | Admitting: Internal Medicine

## 2014-05-20 ENCOUNTER — Other Ambulatory Visit: Payer: Self-pay | Admitting: Geriatric Medicine

## 2014-05-21 ENCOUNTER — Other Ambulatory Visit: Payer: Self-pay | Admitting: Family Medicine

## 2014-06-03 ENCOUNTER — Telehealth: Payer: Self-pay | Admitting: Internal Medicine

## 2014-06-03 NOTE — Telephone Encounter (Signed)
Patient requesting a refill of   montelukast (SINGULAIR) 10 MG tablet [[id number]]

## 2014-06-04 ENCOUNTER — Other Ambulatory Visit: Payer: Self-pay | Admitting: Geriatric Medicine

## 2014-06-04 MED ORDER — MONTELUKAST SODIUM 10 MG PO TABS: ORAL_TABLET | ORAL | Status: DC

## 2014-06-04 NOTE — Telephone Encounter (Signed)
Sent rx for singular to [facility].

## 2014-06-09 ENCOUNTER — Ambulatory Visit: Payer: 59 | Admitting: Family

## 2014-06-09 ENCOUNTER — Encounter: Payer: Self-pay | Admitting: Family

## 2014-06-09 ENCOUNTER — Ambulatory Visit (INDEPENDENT_AMBULATORY_CARE_PROVIDER_SITE_OTHER)
Admission: RE | Admit: 2014-06-09 | Discharge: 2014-06-09 | Disposition: A | Discharge status: Home / Self Care | Payer: Worker's Compensation | Source: Ambulatory Visit | Attending: Family | Admitting: Family

## 2014-06-09 VITALS — BP 132/82 | HR 73 | Temp 98.3°F | Resp 18 | Ht 63.0 in | Wt 193.4 lb

## 2014-06-09 DIAGNOSIS — M25521 Pain in right elbow: Secondary | ICD-10-CM

## 2014-06-09 DIAGNOSIS — M25561 Pain in right knee: Secondary | ICD-10-CM | POA: Insufficient documentation

## 2014-06-09 DIAGNOSIS — M25531 Pain in right wrist: Secondary | ICD-10-CM

## 2014-06-09 DIAGNOSIS — M25539 Pain in unspecified wrist: Secondary | ICD-10-CM | POA: Insufficient documentation

## 2014-06-09 DIAGNOSIS — M25529 Pain in unspecified elbow: Secondary | ICD-10-CM | POA: Insufficient documentation

## 2014-06-09 MED ORDER — NAPROXEN 500 MG PO TABS: 500.0000 mg | ORAL_TABLET | Freq: Two times a day (BID) | ORAL | Status: DC

## 2014-06-09 MED ORDER — MONTELUKAST SODIUM 10 MG PO TABS: ORAL_TABLET | ORAL | Status: DC

## 2014-06-09 NOTE — Assessment & Plan Note (Signed)
Symptoms consistent with contusion and abrasion. Recommend rest, ice, compression, elevation as needed. Follow up if symptoms worsen or fail to improve.

## 2014-06-09 NOTE — Progress Notes (Signed)
Pre visit review using our clinic review tool, if applicable. No additional management support is needed unless otherwise documented below in the visit note. 

## 2014-06-09 NOTE — Progress Notes (Signed)
   Subjective:    Patient ID: Jean Blevins, female    DOB: 22-Nov-1960, 54 y.o.   MRN: [id number]  Chief Complaint  Patient presents with  . [place] at around noon today, hit her right knee, wrist and elbow pain, neck pain, landed on right side    HPI:  Jean Blevins is a 54 y.o. female who presents today for an acute visit.   Acute symptoms of pain started following a fall this afternoon with associated symptoms of the right knee pain, wrist and elbow pain and shoulder/neck pain. Denies any sounds or sensations heard or felt. Modifying factors of ice and motrin have helped a little.   No Known Allergies   Current Outpatient Prescriptions on File Prior to Visit  Medication Sig Dispense Refill  . VENTOLIN HFA 108 (90 BASE) MCG/ACT inhaler USE 2 PUFFS EVERY 6 HOURS AS NEEDED FOR WHEEZING. 18 g 0   No current facility-administered medications on file prior to visit.    Review of Systems  Musculoskeletal: Positive for joint swelling and neck stiffness.  Neurological: Negative for numbness.      Objective:    BP 132/82 mmHg  Pulse 73  Temp(Src) 98.3 F (36.8 C) (Oral)  Resp 18  Ht 5\' 3"  (1.6 m)  Wt 193 lb 6.4 oz (87.726 kg)  BMI 34.27 kg/m2  SpO2 98% Nursing note and vital signs reviewed.  Physical Exam  Constitutional: She is oriented to person, place, and time. She appears well-developed and well-nourished. No distress.  Cardiovascular: Normal rate, regular rhythm, normal heart sounds and intact distal pulses.   Pulmonary/Chest: Effort normal and breath sounds normal.  Musculoskeletal:  Right wrist - no obvious deformity or discoloration noted. Mild edema present. Palpable tenderness along trapezia and just distal to radius styloid process. Grip strength is slightly decreased compared to contralateral side. Wrist range of motion is limited secondary to pain.  Right elbow-no obvious deformity, discoloration, or edema noted. Palpable tenderness medial elbow distal  to the lateral epicondyles. Exhibits full range of motion and 4 out of 5 strength.  Right knee-no obvious deformity or discoloration. Patient indicates mild abrasion on patella. Exhibits full range of motion with 4 out of 5 strength. Ligamentous and meniscal tests are negative.  Neurological: She is alert and oriented to person, place, and time.  Skin: Skin is warm and dry.  Psychiatric: She has a normal mood and affect. Her behavior is normal. Judgment and thought content normal.       Assessment & Plan:

## 2014-06-09 NOTE — Assessment & Plan Note (Signed)
Possible contusion or sprain of right wrist cannot rule out fracture. Obtain right wrist x-rays. Recommend either wrist splint or thumb spica splint for support. Otherwise, rest, ice, compression and elevation. Start naproxen 500 mg twice a day as needed for inflammation.

## 2014-06-09 NOTE — Patient Instructions (Addendum)
Ice to all areas for approximately 20 minutes 2-3 times per day or about every other our as needed.   Start taking the naproxyn sodium for inflammation.  Recommend a wrist splint or thumb spica splint for immobilization.   Keep elevated arm and leg.   Follow up as needed pending x-rays.   Thank you for choosing [facility].  Summary/Instructions:  Your prescription(s) have been submitted to your pharmacy or been printed and provided for you. Please take as directed and contact our office if you believe you are having problem(s) with the medication(s) or have any questions.  Please stop by radiology on the basement level of the building for your x-rays. Your results will be released to [place] (or called to you) after review, usually within 72 hours after test completion. If any treatments or changes are necessary, you will be notified at that same time.  If your symptoms worsen or fail to improve, please contact our office for further instruction, or in case of emergency go directly to the emergency room at the closest medical facility.

## 2014-06-10 ENCOUNTER — Encounter: Payer: Self-pay | Admitting: Family

## 2014-07-27 ENCOUNTER — Other Ambulatory Visit: Payer: Self-pay | Admitting: Obstetrics

## 2014-07-27 DIAGNOSIS — N909 Noninflammatory disorder of vulva and perineum, unspecified: Secondary | ICD-10-CM

## 2014-07-28 ENCOUNTER — Other Ambulatory Visit (INDEPENDENT_AMBULATORY_CARE_PROVIDER_SITE_OTHER): Payer: Worker's Compensation

## 2014-07-28 ENCOUNTER — Encounter: Payer: Self-pay | Admitting: Internal Medicine

## 2014-07-28 ENCOUNTER — Ambulatory Visit (INDEPENDENT_AMBULATORY_CARE_PROVIDER_SITE_OTHER): Payer: Worker's Compensation | Admitting: Internal Medicine

## 2014-07-28 VITALS — BP 110/70 | HR 80 | Temp 98.2°F | Ht 63.0 in | Wt 189.5 lb

## 2014-07-28 DIAGNOSIS — R1112 Projectile vomiting: Secondary | ICD-10-CM | POA: Diagnosis not present

## 2014-07-28 DIAGNOSIS — R197 Diarrhea, unspecified: Secondary | ICD-10-CM | POA: Diagnosis not present

## 2014-07-28 LAB — HEPATIC FUNCTION PANEL
ALBUMIN: 4.3 g/dL (ref 3.5–5.2)
ALT: 124 U/L — ABNORMAL HIGH (ref 0–35)
AST: 109 U/L — ABNORMAL HIGH (ref 0–37)
Alkaline Phosphatase: 58 U/L (ref 39–117)
Bilirubin, Direct: 0.2 mg/dL (ref 0.0–0.3)
Total Bilirubin: 0.6 mg/dL (ref 0.2–1.2)
Total Protein: 7.5 g/dL (ref 6.0–8.3)

## 2014-07-28 LAB — LIPASE: Lipase: 279 U/L — ABNORMAL HIGH (ref 11.0–59.0)

## 2014-07-28 LAB — CBC WITH DIFFERENTIAL/PLATELET
BASOS ABS: 0 10*3/uL (ref 0.0–0.1)
Basophils Relative: 0.4 % (ref 0.0–3.0)
EOS PCT: 3.4 % (ref 0.0–5.0)
Eosinophils Absolute: 0.3 10*3/uL (ref 0.0–0.7)
HEMATOCRIT: 42.3 % (ref 36.0–46.0)
HEMOGLOBIN: 14.6 g/dL (ref 12.0–15.0)
LYMPHS PCT: 33 % (ref 12.0–46.0)
Lymphs Abs: 2.8 10*3/uL (ref 0.7–4.0)
MCHC: 34.5 g/dL (ref 30.0–36.0)
MCV: 86.9 fl (ref 78.0–100.0)
MONOS PCT: 8 % (ref 3.0–12.0)
Monocytes Absolute: 0.7 10*3/uL (ref 0.1–1.0)
Neutro Abs: 4.6 10*3/uL (ref 1.4–7.7)
Neutrophils Relative %: 55.2 % (ref 43.0–77.0)
PLATELETS: 220 10*3/uL (ref 150.0–400.0)
RBC: 4.87 Mil/uL (ref 3.87–5.11)
RDW: 13.3 % (ref 11.5–15.5)
WBC: 8.4 10*3/uL (ref 4.0–10.5)

## 2014-07-28 LAB — URINALYSIS
BILIRUBIN URINE: NEGATIVE
KETONES UR: NEGATIVE
LEUKOCYTES UA: NEGATIVE
Nitrite: NEGATIVE
PH: 6 (ref 5.0–8.0)
Specific Gravity, Urine: 1.025 (ref 1.000–1.030)
Urine Glucose: NEGATIVE
Urobilinogen, UA: 0.2 (ref 0.0–1.0)

## 2014-07-28 LAB — BASIC METABOLIC PANEL
BUN: 8 mg/dL (ref 6–23)
CHLORIDE: 107 meq/L (ref 96–112)
CO2: 29 meq/L (ref 19–32)
Calcium: 9.1 mg/dL (ref 8.4–10.5)
Creatinine, Ser: 0.77 mg/dL (ref 0.40–1.20)
GFR: 83.23 mL/min (ref >60.00)
GLUCOSE: 100 mg/dL — AB (ref 70–99)
Potassium: 3.5 mEq/L (ref 3.5–5.1)
SODIUM: 141 meq/L (ref 135–145)

## 2014-07-28 LAB — AMYLASE: AMYLASE: 95 U/L (ref 27–131)

## 2014-07-28 MED ORDER — DIPHENOXYLATE-ATROPINE 2.5-0.025 MG PO TABS: 1.0000 | ORAL_TABLET | Freq: Four times a day (QID) | ORAL | Status: DC | PRN

## 2014-07-28 NOTE — Progress Notes (Signed)
   Subjective:    Patient ID: Jean Blevins, female    DOB: [birth year], 54 y.o.   MRN: [id number]  HPI Her symptoms began 07/25/14 as frank diarrhea approximately 11 PM -12 midnight. She had eaten chili and a  baked potato between 6& 7 PM at a fast food restaurant. Because of history of dyspepsia with chili she did take Gaviscon.  The diarrhea had persisted following the initial episode every 1-2 hours for over 24 hours 3/6.  She's had associated nausea and vomiting which she describes as "projectile". This began 30-60 minutes after onset of diarrhea. This recurred until 3-4 am 3/6. She's been taking Imodium with only partial response. Stools continue to be watery; she's had 2 since this morning. She's remained on Kaopectate and Imodium.  She's eating chicken noodle soup & drinking Gatorade.She did have an egg with cheese.  There's been no travel; sick individual exposure; sick pet exposure; antibiotics in the last 30 days; or suspicious food or drink congestion.   Her mother has hadd ulcer.  She had her gallbladder removed in 1987.  Her asthma is quiescent and she rarely uses her  rescue inhaler.  Other symptoms include some frontal headache with rhinitis. Secretions are clear without purulence. She has some fatigue. She's also noticed some lightheadedness.  She denies any other GI or GU symptoms.   Review of Systems Unexplained weight loss, significant dyspepsia, dysphagia, melena, or rectal bleeding denied. Dysuria, pyuria, hematuria, frequency, nocturia or polyuria are denied.    Objective:   Physical Exam Positive or pertinent findings include: There is no oropharyngeal erythema; tongue drying; or tenting.  She has a well-healed right upper quadrant operative scar. Abdomen is protuberant but not distended.  General appearance is one of good health and nourishment w/o distress. Eyes: No conjunctival inflammation or scleral icterus is present. Ears: normal TMs Oral exam: Dental  hygiene is good; lips and gums are healthy appearing. Heart:  Normal rate and regular rhythm. S1 and S2 normal without gallop, murmur, click, rub or other extra sounds   Lungs:Chest clear to auscultation; no wheezes, rhonchi,rales ,or rubs present.No increased work of breathing.  Abdomen: bowel sounds normal, soft and non-tender without masses, organomegaly or hernias noted.  No guarding or rebound . No tenderness over the flanks to percussion Musculoskeletal: Able to lie flat and sit up without help. Negative straight leg raising bilaterally. Gait normal Skin:Warm & dry.  Intact without suspicious lesions or rashes ; no jaundice or tenting Lymphatic: No lymphadenopathy is noted about the head, neck, axilla          Assessment & Plan:  #1 projectile vomiting, profuse diarrhea without definitive etiology.Clinically not dehydrated  Plan: See orders recommendations. Most important is to rule out subclinical hepatitis or pyelonephritis.

## 2014-07-28 NOTE — Progress Notes (Signed)
Pre visit review using our clinic review tool, if applicable. No additional management support is needed unless otherwise documented below in the visit note. 

## 2014-07-28 NOTE — Patient Instructions (Signed)
Stay on clear liquids for 48-72 hours or until bowels are normal.This would include  jello, sherbert (NOT ice cream), Li[employer] chicken noodle soup(NOT cream based soups),Gatorade Lite, flat Ginger ale (without High Fructose Corn Syrup),dry toast or crackers, baked potato.No milk , dairy or grease until bowels are formed.Flrastor or  Align , a [employer] , daily if stools are loose. lomotil for frankly watery stool. Report increasing pain, fever or rectal bleeding

## 2014-07-29 ENCOUNTER — Other Ambulatory Visit: Payer: Self-pay | Admitting: Internal Medicine

## 2014-07-29 DIAGNOSIS — R945 Abnormal results of liver function studies: Secondary | ICD-10-CM

## 2014-07-29 DIAGNOSIS — R197 Diarrhea, unspecified: Secondary | ICD-10-CM

## 2014-07-29 DIAGNOSIS — R7989 Other specified abnormal findings of blood chemistry: Secondary | ICD-10-CM | POA: Insufficient documentation

## 2014-07-29 DIAGNOSIS — R1112 Projectile vomiting: Secondary | ICD-10-CM

## 2014-07-29 DIAGNOSIS — R748 Abnormal levels of other serum enzymes: Secondary | ICD-10-CM

## 2014-07-31 ENCOUNTER — Other Ambulatory Visit: Payer: Self-pay | Admitting: *Deleted

## 2014-07-31 DIAGNOSIS — R748 Abnormal levels of other serum enzymes: Secondary | ICD-10-CM

## 2014-07-31 DIAGNOSIS — R1112 Projectile vomiting: Secondary | ICD-10-CM

## 2014-07-31 DIAGNOSIS — R945 Abnormal results of liver function studies: Secondary | ICD-10-CM

## 2014-07-31 DIAGNOSIS — R7989 Other specified abnormal findings of blood chemistry: Secondary | ICD-10-CM

## 2014-08-04 ENCOUNTER — Other Ambulatory Visit: Payer: Self-pay | Admitting: Internal Medicine

## 2014-08-04 ENCOUNTER — Ambulatory Visit
Admission: RE | Admit: 2014-08-04 | Discharge: 2014-08-04 | Disposition: A | Discharge status: Home / Self Care | Payer: 59 | Source: Ambulatory Visit | Attending: Internal Medicine | Admitting: Internal Medicine

## 2014-08-04 DIAGNOSIS — R112 Nausea with vomiting, unspecified: Secondary | ICD-10-CM

## 2014-08-04 DIAGNOSIS — R7989 Other specified abnormal findings of blood chemistry: Secondary | ICD-10-CM

## 2014-08-04 DIAGNOSIS — R1112 Projectile vomiting: Secondary | ICD-10-CM

## 2014-08-04 DIAGNOSIS — R945 Abnormal results of liver function studies: Secondary | ICD-10-CM

## 2014-08-04 DIAGNOSIS — R748 Abnormal levels of other serum enzymes: Secondary | ICD-10-CM

## 2014-08-06 ENCOUNTER — Other Ambulatory Visit: Payer: Self-pay | Admitting: Obstetrics

## 2014-08-06 DIAGNOSIS — N909 Noninflammatory disorder of vulva and perineum, unspecified: Secondary | ICD-10-CM

## 2014-08-07 ENCOUNTER — Other Ambulatory Visit: Payer: Self-pay

## 2014-08-10 ENCOUNTER — Other Ambulatory Visit: Payer: Self-pay

## 2014-08-11 ENCOUNTER — Other Ambulatory Visit: Payer: Self-pay | Admitting: Internal Medicine

## 2014-08-11 ENCOUNTER — Telehealth: Payer: Self-pay | Admitting: Internal Medicine

## 2014-08-11 DIAGNOSIS — R945 Abnormal results of liver function studies: Principal | ICD-10-CM

## 2014-08-11 DIAGNOSIS — R7989 Other specified abnormal findings of blood chemistry: Secondary | ICD-10-CM

## 2014-08-11 DIAGNOSIS — R748 Abnormal levels of other serum enzymes: Secondary | ICD-10-CM

## 2014-08-11 NOTE — Telephone Encounter (Signed)
Dr hopper, do you still want patient to have labs done

## 2014-08-11 NOTE — Telephone Encounter (Signed)
Orders entered ; must be done by 3/31 or these expire

## 2014-08-11 NOTE — Telephone Encounter (Signed)
Patient calling re: labs entered on 08/04/2014. She did not see the mychart message until 08/07/2014. Please enter labs again and notify patient so that she can go and have them done

## 2014-08-12 ENCOUNTER — Other Ambulatory Visit: Payer: Self-pay

## 2014-08-12 NOTE — Telephone Encounter (Signed)
Phone call to patient. Notified via voicemail on home machine

## 2014-08-15 ENCOUNTER — Ambulatory Visit (INDEPENDENT_AMBULATORY_CARE_PROVIDER_SITE_OTHER): Payer: Worker's Compensation | Admitting: Family Medicine

## 2014-08-15 ENCOUNTER — Encounter: Payer: Self-pay | Admitting: Family Medicine

## 2014-08-15 VITALS — BP 112/74 | HR 96 | Temp 99.8°F | Wt 191.0 lb

## 2014-08-15 DIAGNOSIS — J069 Acute upper respiratory infection, unspecified: Secondary | ICD-10-CM | POA: Diagnosis not present

## 2014-08-15 MED ORDER — AZITHROMYCIN 250 MG PO TABS: ORAL_TABLET | ORAL | Status: DC

## 2014-08-15 NOTE — Progress Notes (Signed)
SUBJECTIVE:  Jean Blevins is a 54 y.o. female pt of Dr. Doug Sou, new to me who presents to weekend clinic with complains of congestion, sore throat, productive cough, myalgias and fever for 3 days. Tmax 102.2.  She has had her influenza vaccine this season.   She denies a history of chest pain, nausea and vomiting and admits to a history of asthma. Patient denies smoke cigarettes.   Current Outpatient Prescriptions on File Prior to Visit  Medication Sig Dispense Refill  . diphenoxylate-atropine (LOMOTIL) 2.5-0.025 MG per tablet Take 1 tablet by mouth 4 (four) times daily as needed for diarrhea or loose stools. 28 tablet 0  . montelukast (SINGULAIR) 10 MG tablet TAKE ONE TABLET AT BEDTIME. 30 tablet 3  . naproxen (NAPROSYN) 500 MG tablet Take 1 tablet (500 mg total) by mouth 2 (two) times daily with a meal. 60 tablet 0  . VENTOLIN HFA 108 (90 BASE) MCG/ACT inhaler USE 2 PUFFS EVERY 6 HOURS AS NEEDED FOR WHEEZING. 18 g 0   No current facility-administered medications on file prior to visit.    No Known Allergies  Past Medical History  Diagnosis Date  . Depression   . Asthma   . Allergic rhinitis   . Palpitations 03/17/2010    Qualifier: Diagnosis of  By: Linda Hedges MD, Heinz Knuckles     Past Surgical History  Procedure Laterality Date  . Abdominal hysterectomy  2008    heavy menstrual bleeding  . Lumbar spine surgery      herniated disc  . Appendectomy    . Cholecystectomy    . Ankle surgery      Family History  Problem Relation Age of Onset  . Cancer Mother     breast  . Diabetes Mother   . Heart disease Father     MI in his 69s  . Hypertension Father   . Hyperlipidemia Father   . Diabetes Maternal Grandmother     History   Social History  . Marital Status: Divorced    Spouse Name: N/A  . Number of Children: N/A  . Years of Education: N/A   Occupational History  . Not on file.   Social History Main Topics  . Smoking status: Never Smoker   . Smokeless tobacco: Not on  file  . Alcohol Use: No  . Drug Use: Not on file  . Sexual Activity: Not on file   Other Topics Concern  . Not on file   Social History Narrative   Work or School: medical records      Home Situation: lives with mother      Spiritual Beliefs: beliefs yes, no church      Lifestyle: no regular exercise; diet is so so             OBJECTIVE: BP 112/74 mmHg  Pulse 96  Temp(Src) 99.8 F (37.7 C) (Oral)  Wt 191 lb (86.637 kg)  SpO2 97%  She appears well, vital signs are as noted. Ears normal.  Throat and pharynx normal.  Neck supple. No adenopathy in the neck. Nose is congested. Sinuses non tender. The chest is clear, without wheezes or rales.  ASSESSMENT:  viral upper respiratory illness  PLAN: Given her history of asthma and symptoms during a holiday weekend, will print rx for zpack to fill if symptoms progress.  Symptomatic therapy suggested: push fluids, rest and return office visit prn if symptoms persist or worsen. Call or return to clinic prn if these symptoms worsen or fail to  improve as anticipated.

## 2014-08-15 NOTE — Progress Notes (Signed)
Pre visit review using our clinic review tool, if applicable. No additional management support is needed unless otherwise documented below in the visit note. 

## 2014-08-15 NOTE — Patient Instructions (Signed)
Nice to meet you. This is likely a virus but if your symptoms worsen or progress ,please take zpack as directed. Call your primary doctor on Monday with an update of your symptoms.

## 2014-08-17 ENCOUNTER — Telehealth: Payer: Self-pay | Admitting: *Deleted

## 2014-08-17 ENCOUNTER — Encounter: Payer: Self-pay | Admitting: Internal Medicine

## 2014-08-17 ENCOUNTER — Other Ambulatory Visit (INDEPENDENT_AMBULATORY_CARE_PROVIDER_SITE_OTHER): Payer: Worker's Compensation

## 2014-08-17 ENCOUNTER — Ambulatory Visit (INDEPENDENT_AMBULATORY_CARE_PROVIDER_SITE_OTHER): Payer: Worker's Compensation | Admitting: Internal Medicine

## 2014-08-17 VITALS — BP 90/62 | HR 86 | Temp 98.8°F | Ht 63.0 in | Wt 190.0 lb

## 2014-08-17 DIAGNOSIS — R059 Cough, unspecified: Secondary | ICD-10-CM

## 2014-08-17 DIAGNOSIS — J069 Acute upper respiratory infection, unspecified: Secondary | ICD-10-CM

## 2014-08-17 DIAGNOSIS — K760 Fatty (change of) liver, not elsewhere classified: Secondary | ICD-10-CM | POA: Diagnosis not present

## 2014-08-17 DIAGNOSIS — R945 Abnormal results of liver function studies: Secondary | ICD-10-CM

## 2014-08-17 DIAGNOSIS — R05 Cough: Secondary | ICD-10-CM | POA: Diagnosis not present

## 2014-08-17 DIAGNOSIS — R7989 Other specified abnormal findings of blood chemistry: Secondary | ICD-10-CM

## 2014-08-17 DIAGNOSIS — R748 Abnormal levels of other serum enzymes: Secondary | ICD-10-CM

## 2014-08-17 LAB — HEPATITIS PANEL, ACUTE
HCV Ab: NEGATIVE
HEP B S AG: NEGATIVE
Hep A IgM: NONREACTIVE
Hep B C IgM: NONREACTIVE

## 2014-08-17 LAB — CBC WITH DIFFERENTIAL/PLATELET
BASOS PCT: 0.8 % (ref 0.0–3.0)
Basophils Absolute: 0 10*3/uL (ref 0.0–0.1)
Eosinophils Absolute: 0.3 10*3/uL (ref 0.0–0.7)
Eosinophils Relative: 7 % — ABNORMAL HIGH (ref 0.0–5.0)
HCT: 39.6 % (ref 36.0–46.0)
HEMOGLOBIN: 13.8 g/dL (ref 12.0–15.0)
LYMPHS PCT: 41.8 % (ref 12.0–46.0)
Lymphs Abs: 2 10*3/uL (ref 0.7–4.0)
MCHC: 34.7 g/dL (ref 30.0–36.0)
MCV: 86.9 fl (ref 78.0–100.0)
MONO ABS: 0.3 10*3/uL (ref 0.1–1.0)
MONOS PCT: 6.1 % (ref 3.0–12.0)
Neutro Abs: 2.1 10*3/uL (ref 1.4–7.7)
Neutrophils Relative %: 44.3 % (ref 43.0–77.0)
PLATELETS: 184 10*3/uL (ref 150.0–400.0)
RBC: 4.56 Mil/uL (ref 3.87–5.11)
RDW: 13.4 % (ref 11.5–15.5)
WBC: 4.7 10*3/uL (ref 4.0–10.5)

## 2014-08-17 LAB — HEPATIC FUNCTION PANEL
ALK PHOS: 50 U/L (ref 39–117)
ALT: 80 U/L — ABNORMAL HIGH (ref 0–35)
AST: 69 U/L — ABNORMAL HIGH (ref 0–37)
Albumin: 4 g/dL (ref 3.5–5.2)
BILIRUBIN TOTAL: 0.6 mg/dL (ref 0.2–1.2)
Bilirubin, Direct: 0.1 mg/dL (ref 0.0–0.3)
TOTAL PROTEIN: 7.3 g/dL (ref 6.0–8.3)

## 2014-08-17 LAB — LIPASE: LIPASE: 49 U/L (ref 11.0–59.0)

## 2014-08-17 MED ORDER — ALBUTEROL SULFATE HFA 108 (90 BASE) MCG/ACT IN AERS: INHALATION_SPRAY | RESPIRATORY_TRACT | Status: DC

## 2014-08-17 MED ORDER — HYDROCODONE-HOMATROPINE 5-1.5 MG/5ML PO SYRP: 5.0000 mL | ORAL_SOLUTION | Freq: Four times a day (QID) | ORAL | Status: DC | PRN

## 2014-08-17 MED ORDER — MONTELUKAST SODIUM 10 MG PO TABS: ORAL_TABLET | ORAL | Status: DC

## 2014-08-17 NOTE — Patient Instructions (Addendum)
Please remain out of work until 08/19/2014.  Plain Mucinex (NOT D) for thick secretions ;force NON dairy fluids .   Nasal cleansing in the shower as discussed with lather of mild shampoo.After 10 seconds wash off lather while  exhaling through nostrils. Make sure that all residual soap is removed to prevent irritation.  Flonase OR Nasacort AQ 1 spray in each nostril twice a day as needed. Use the "crossover" technique into opposite nostril spraying toward opposite ear @ 45 degree angle, not straight up into nostril.  Plain Allegra (NOT D )  160 daily , Loratidine 10 mg , OR Zyrtec 10 mg @ bedtime  as needed for itchy eyes & sneezing. Your next office appointment will be determined based upon review of your pending labs 7/or xrays  Those instructions will be transmitted to you by My Chart Critical results will be called. Followup as needed for any active or acute issue. Please report any significant change in your symptoms.  Labs today include Acute Hepatitis panel, Lipase, Hepatic Panel , CBC & dif

## 2014-08-17 NOTE — Progress Notes (Signed)
Pre visit review using our clinic review tool, if applicable. No additional management support is needed unless otherwise documented below in the visit note. 

## 2014-08-17 NOTE — Telephone Encounter (Signed)
[facility] - Client TELEPHONE ADVICE RECORD [facility] Patient Name: Jean Blevins Gender: Female DOB: Jun 17, 1960 Age: 54 Y 33 M Return Phone Number: [phone] (Primary) Address: City/State/Zip: [place] [address] Client [facility] - Client Client Site [place] Physician [place], [place] Type Call Call Type Triage / Clinical Relationship To Patient Self Return Phone Number [phone] (Primary) Chief Complaint Sore Throat Initial Comment Caller states she has fever for last two days, sore throat, body aches PreDisposition Call Doctor Nurse Assessment Nurse: Rock Nephew, RN, Juliann Pulse Date/Time (Eastern Time): 08/15/2014 8:09:56 AM Confirm and document reason for call. If symptomatic, describe symptoms. ---Caller states she has fever for last two days, sore throat, a cough and body aches Has the patient traveled out of the country within the last 30 days? ---Not Applicable Does the patient require triage? ---Yes Related visit to physician within the last 2 weeks? ---No Does the PT have any chronic conditions? (i.e. diabetes, asthma, etc.) ---Yes List chronic conditions. ---Asthma Did the patient indicate they were pregnant? ---No Guidelines Guideline Title Affirmed Question Affirmed Notes Nurse Date/Time (Eastern Time) Common Cold Fever present > 3 days (72 hours) Rock Nephew, RN, Juliann Pulse 08/15/2014 8:10:59 AM Disp. Time Eilene Ghazi Time) Disposition Final User 08/15/2014 8:13:40 AM See Physician within 24 Hours Yes Rock Nephew, RN, Gara Kroner Understands: Yes Disagree/Comply: Comply Care Advice Given Per Guideline PLEASE NOTE: All timestamps contained within this report are represented as [place] Standard Time. CONFIDENTIALTY NOTICE: This fax transmission is intended only for the addressee. It contains information that is legally privileged, confidential or otherwise protected from use or  disclosure. If you are not the intended recipient, you are strictly prohibited from reviewing, disclosing, copying using or disseminating any of this information or taking any action in reliance on or regarding this information. If you have received this fax in error, please notify us immediately by telephone so that we can arrange for its return to [place]. Phone: [phone], Toll-Free: [phone], Fax: [phone] Page: 2 of 2 Call Id: [id number] Care Advice Given Per Guideline SEE PHYSICIAN WITHIN 24 HOURS: * IF OFFICE WILL BE OPEN: You need to be examined within the next 24 hours. Call your doctor when the office opens, and make an appointment. FOR ALL FEVERS: * Drink cold fluids to prevent dehydration. * For fever relief, take acetaminophen or ibuprofen. HUMIDIFIER: If the air in your home is dry, use a humidifier. CARE ADVICE given per Colds (Adult) guideline. * You become worse. After Care Instructions Given Call Event Type User Date / Time Description

## 2014-08-17 NOTE — Progress Notes (Signed)
Subjective:    Patient ID: Jean Blevins, female    DOB: [birth year], 54 y.o.   MRN: [id number]  HPI She is here for acute issue which began 3/23 a sore throat and nonproductive cough. As of 3/24 she was having fever chills, and body aches. As of 3/25 she had temperature up to 102.2. She was seen in the Saturday clinic and prescribed Z-Pak. As of 3/27 she's had some yellow nasal discharge. She also describes some left otic pain. She is not actually producing any sputum. She's been using her Ventolin concerned that the cough is related to her asthma. She has had some incontinence with the cough.   She was seen 07/28/14 with projectile vomiting nausea as well as diarrhea. She also had left upper quadrant pain. All those symptoms have resolved. Labs that day revealed AST 109, ALT 124, and lipase of 279. Differential was normal. Ultrasound revealed she's had cholecystectomy; fatty infiltration of liver documented. The labs were reviewed 3/9 and read by her 3/12. To date she had not followed up as recommended.  She rarely drinks alcohol. Last TG on record 119 in 02/2014.   Review of Systems   She denies frontal headache, facial pain, dental pain, or otic discharge.  Unexplained weight loss, abdominal pain, significant dyspepsia, dysphagia, melena, rectal bleeding, or persistently small caliber stools are denied.       Objective:   Physical Exam  Pertinent positive findings include: She has erythema of the nasal mucosa. She has paroxysmal nonproductive cough.  Gen.: Adequately nourished in appearance. Alert, appropriate and cooperative throughout exam. BMI:33.67 Appears younger than stated age  Head: Normocephalic without obvious abnormalities  Eyes: No corneal or conjunctival inflammation noted. Pupils equal round reactive to light and accommodation. Extraocular motion intact. No icterus Ears: External  ear exam reveals no significant lesions or deformities. Canals clear .TMs normal.  Hearing is grossly normal bilaterally. Nose: External nasal exam reveals no deformity or inflammation. Nasal mucosa are pink and moist. No lesions or exudates noted.   Mouth: Oral mucosa and oropharynx reveal no lesions or exudates. Teeth in good repair. Neck: No deformities, masses, or tenderness noted. Range of motion &. Thyroid normal. Lungs: Normal respiratory effort; chest expands symmetrically. Lungs are clear to auscultation without rales, wheezes, or increased work of breathing. Heart: Normal rate and rhythm. Normal S1 and S2. No gallop, click, or rub. No murmur. Abdomen: Bowel sounds normal; abdomen soft and nontender. No masses, organomegaly or hernias noted. Musculoskeletal/extremities: No deformity or scoliosis noted of  the thoracic or lumbar spine.  No clubbing, cyanosis, edema, or significant extremity  deformity noted.  Range of motion normal . Tone & strength normal. Hand joints normal.  Fingernail  health good. Able to lie down & sit up w/o help.  Negative SLR bilaterally Vascular: Carotid, radial artery, dorsalis pedis and  posterior tibial pulses are full and equal. No bruits present. Neurologic: Alert and oriented x3. Deep tendon reflexes symmetrical and normal.  Gait normal     Skin: Intact without suspicious lesions or rashes.No jaundice or tenting Lymph: No cervical, axillary lymphadenopathy present. Psych: Mood and affect are somewhat subdued  Assessment & Plan:  #1 upper respiratory tract infection without definite sinusitis  #2 cough w/o bronchospasm  #3elevated liver enzymes and lipase  #4 fatty liver  Plan: See orders recommendations

## 2014-08-18 ENCOUNTER — Other Ambulatory Visit: Payer: Self-pay

## 2014-08-18 DIAGNOSIS — K760 Fatty (change of) liver, not elsewhere classified: Secondary | ICD-10-CM | POA: Insufficient documentation

## 2014-08-24 ENCOUNTER — Telehealth: Payer: Self-pay | Admitting: Internal Medicine

## 2014-08-24 MED ORDER — FLUCONAZOLE 150 MG PO TABS: 150.0000 mg | ORAL_TABLET | ORAL | Status: DC

## 2014-08-24 NOTE — Telephone Encounter (Signed)
Called in, take 1 today and if no better by Wednesday take the other pill.

## 2014-08-24 NOTE — Telephone Encounter (Signed)
Please advise, thanks.

## 2014-08-24 NOTE — Telephone Encounter (Signed)
Dr. Doug Sou pt Seen Dr. Deborra Medina on 3/26 and Dr. Linna Darner on 3/28.  She was prescribed an antibiotic.  She is requesting 2 diflucan to be sent to [facility].

## 2014-08-24 NOTE — Telephone Encounter (Signed)
Patient aware and will go pick up 

## 2014-12-08 ENCOUNTER — Telehealth: Payer: Self-pay

## 2014-12-08 NOTE — Telephone Encounter (Signed)
Called patient to see if a recent mammogram has been done. LMOVM for pt to call back to advise is recently done and if not, is it ok to order?

## 2015-02-09 ENCOUNTER — Ambulatory Visit (INDEPENDENT_AMBULATORY_CARE_PROVIDER_SITE_OTHER): Payer: 59 | Admitting: Internal Medicine

## 2015-02-09 ENCOUNTER — Other Ambulatory Visit (INDEPENDENT_AMBULATORY_CARE_PROVIDER_SITE_OTHER): Payer: 59

## 2015-02-09 ENCOUNTER — Encounter: Payer: Self-pay | Admitting: Internal Medicine

## 2015-02-09 VITALS — BP 120/76 | HR 78 | Temp 98.5°F | Resp 18 | Wt 192.0 lb

## 2015-02-09 DIAGNOSIS — R140 Abdominal distension (gaseous): Secondary | ICD-10-CM

## 2015-02-09 DIAGNOSIS — R195 Other fecal abnormalities: Secondary | ICD-10-CM

## 2015-02-09 DIAGNOSIS — M542 Cervicalgia: Secondary | ICD-10-CM | POA: Diagnosis not present

## 2015-02-09 DIAGNOSIS — R1012 Left upper quadrant pain: Secondary | ICD-10-CM

## 2015-02-09 DIAGNOSIS — R194 Change in bowel habit: Secondary | ICD-10-CM

## 2015-02-09 LAB — CBC WITH DIFFERENTIAL/PLATELET
BASOS PCT: 0.7 % (ref 0.0–3.0)
Basophils Absolute: 0.1 10*3/uL (ref 0.0–0.1)
Eosinophils Absolute: 0.5 10*3/uL (ref 0.0–0.7)
Eosinophils Relative: 5.1 % — ABNORMAL HIGH (ref 0.0–5.0)
HEMATOCRIT: 41.1 % (ref 36.0–46.0)
HEMOGLOBIN: 13.9 g/dL (ref 12.0–15.0)
LYMPHS PCT: 41.1 % (ref 12.0–46.0)
Lymphs Abs: 4 10*3/uL (ref 0.7–4.0)
MCHC: 33.8 g/dL (ref 30.0–36.0)
MCV: 89.5 fl (ref 78.0–100.0)
MONO ABS: 0.5 10*3/uL (ref 0.1–1.0)
Monocytes Relative: 4.8 % (ref 3.0–12.0)
Neutro Abs: 4.7 10*3/uL (ref 1.4–7.7)
Neutrophils Relative %: 48.3 % (ref 43.0–77.0)
Platelets: 241 10*3/uL (ref 150.0–400.0)
RBC: 4.59 Mil/uL (ref 3.87–5.11)
RDW: 13.1 % (ref 11.5–15.5)
WBC: 9.6 10*3/uL (ref 4.0–10.5)

## 2015-02-09 LAB — HEPATIC FUNCTION PANEL
ALT: 65 U/L — AB (ref 0–35)
AST: 54 U/L — AB (ref 0–37)
Albumin: 4.2 g/dL (ref 3.5–5.2)
Alkaline Phosphatase: 52 U/L (ref 39–117)
BILIRUBIN TOTAL: 0.5 mg/dL (ref 0.2–1.2)
Bilirubin, Direct: 0.1 mg/dL (ref 0.0–0.3)
TOTAL PROTEIN: 7.5 g/dL (ref 6.0–8.3)

## 2015-02-09 LAB — LIPASE: LIPASE: 55 U/L (ref 11.0–59.0)

## 2015-02-09 LAB — TSH: TSH: 3.02 u[IU]/mL (ref 0.35–4.50)

## 2015-02-09 MED ORDER — RANITIDINE HCL 150 MG PO TABS: 150.0000 mg | ORAL_TABLET | Freq: Two times a day (BID) | ORAL | Status: DC

## 2015-02-09 NOTE — Progress Notes (Signed)
Pre visit review using our clinic review tool, if applicable. No additional management support is needed unless otherwise documented below in the visit note. 

## 2015-02-09 NOTE — Progress Notes (Signed)
   Subjective:    Patient ID: Jean Blevins, female    DOB: [birth year], 54 y.o.   MRN: [id number]  HPI She began to have nausea and left upper quadrant discomfort 1 week ago. Initially it was intermittent but it has become somewhat constant. It is described as dull and nonradiating. There was no specific trigger although she questions whether it might be worse after certain meals. There was no specific food trigger. She has not treated this.  She's had some decrease in her appetite. Weight is stable. She's had bloating for a month.  Stools tend to be thin or loose-watery.  She has never smoked. She rarely has alcohol. She did recently have 2 beers. She takes ibuprofen occasionally. She has 12 ounces of tea a day.  She has had no colonoscopy to date stating that it's a question of " time commitment and procrastination". She has a PMH of fatty liver.  Family history is negative for GI disease.  She has no other cardio pulmonary, GI, or GU symptoms.  She saw her Gynecologist in January. She's had a total abdominal hysterectomy but still has her ovaries.   Review of Systems  There is no significant cough, sputum production,hemoptysis, wheezing,or  paroxysmal nocturnal dyspnea. Unexplained weight loss,  significant dyspepsia, dysphagia, melena, or rectal bleeding are not present. Dysuria, pyuria, hematuria, frequency, nocturia or polyuria are denied.    Objective:   Physical Exam Pertinent or positive findings include: She states that the left inferior thyroid is tender with palpation. There is no abnormality to palpation. She has lipomatous changes @ the base of the neck bilaterally. Abdomen is protuberant. Bowel sounds are hyperactive.  General appearance :adequately nourished; in no distress.  Eyes: No conjunctival inflammation or scleral icterus is present.  Oral exam:  Lips and gums are healthy appearing.There is no oropharyngeal erythema or exudate noted. Dental hygiene is  good.  Heart:  Normal rate and regular rhythm. S1 and S2 normal without gallop, murmur, click, rub or other extra sounds    Lungs:Chest clear to auscultation; no wheezes, rhonchi,rales ,or rubs present.No increased work of breathing.   Abdomen: bowel sounds normal, soft and non-tender without masses, organomegaly or hernias noted.  No guarding or rebound. No flank tenderness to percussion.  Vascular : all pulses equal ; no bruits present.  Skin:Warm & dry.  Intact without suspicious lesions or rashes ; no tenting or jaundice   Lymphatic: No lymphadenopathy is noted about the head, neck, axilla.   Neuro: Strength, tone & DTRs normal.     Assessment & Plan:   #1 left upper quadrant discomfort  #2 tenderness left thyroid w/o nodules clinically  #3 bloating; if this persists she should have follow-up with her gynecologist. This was discussed.  #3 change in bowel movements; no colonoscopy to date. Standard of care discussed.I recommended she go to GI today to discuss protocol.  Plan: See orders recommendations

## 2015-02-09 NOTE — Patient Instructions (Signed)
Reflux of gastric acid may be asymptomatic as this may occur mainly during sleep.The triggers for reflux  include stress; the "aspirin family" ; alcohol; peppermint; and caffeine (coffee, tea, cola, and chocolate). The aspirin family would include aspirin and the nonsteroidal agents such as ibuprofen &  Naproxen. Tylenol would not cause reflux. If having symptoms ; food & drink should be avoided for @ least 2 hours before going to bed.  As per the Standard of Care , screening Colonoscopy recommended @ 50 & every 5-10 years thereafter . More frequent monitor would be dictated by family history or findings @ Colonoscopy

## 2015-02-16 ENCOUNTER — Encounter: Payer: Self-pay | Admitting: Internal Medicine

## 2015-02-16 ENCOUNTER — Ambulatory Visit (INDEPENDENT_AMBULATORY_CARE_PROVIDER_SITE_OTHER): Payer: 59 | Admitting: Internal Medicine

## 2015-02-16 VITALS — BP 104/64 | HR 85 | Temp 98.6°F | Ht 63.0 in | Wt 191.0 lb

## 2015-02-16 DIAGNOSIS — K1121 Acute sialoadenitis: Secondary | ICD-10-CM | POA: Diagnosis not present

## 2015-02-16 MED ORDER — AMOXICILLIN-POT CLAVULANATE 875-125 MG PO TABS: 1.0000 | ORAL_TABLET | Freq: Two times a day (BID) | ORAL | Status: DC

## 2015-02-16 NOTE — Assessment & Plan Note (Signed)
Mild to mod, for antibx course,  to f/u any worsening symptoms or concerns 

## 2015-02-16 NOTE — Progress Notes (Signed)
   Subjective:    Patient ID: Jean Blevins, female    DOB: Feb 23, 1961, 54 y.o.   MRN: [id number]  HPI  Here with acute onset today of left check/facial swelling/erythema similar to hx of parotitis several yrs ago, some pain to left ear but no hearing loss or dizziness  Pt denies chest pain, increased sob or doe, wheezing, orthopnea, PND, increased LE swelling, palpitations, dizziness or syncope.  Pt denies new neurological symptoms such as new headache, or facial or extremity weakness or numbness Past Medical History  Diagnosis Date  . Depression   . Asthma   . Allergic rhinitis   . Palpitations 03/17/2010    Qualifier: Diagnosis of  By: Linda Hedges MD, Heinz Knuckles    Past Surgical History  Procedure Laterality Date  . Abdominal hysterectomy  2008    heavy menstrual bleeding  . Lumbar spine surgery      herniated disc  . Appendectomy    . Cholecystectomy    . Ankle surgery      reports that she has never smoked. She does not have any smokeless tobacco history on file. She reports that she does not drink alcohol. Her drug history is not on file. family history includes Cancer in her mother; Diabetes in her maternal grandmother and mother; Heart disease in her father; Hyperlipidemia in her father; Hypertension in her father. No Known Allergies Current Outpatient Prescriptions on File Prior to Visit  Medication Sig Dispense Refill  . albuterol (VENTOLIN HFA) 108 (90 BASE) MCG/ACT inhaler USE 2 PUFFS EVERY 6 HOURS AS NEEDED FOR WHEEZING. 18 g 11  . montelukast (SINGULAIR) 10 MG tablet TAKE ONE TABLET AT BEDTIME. 30 tablet 11  . ranitidine (ZANTAC) 150 MG tablet Take 1 tablet (150 mg total) by mouth 2 (two) times daily. 60 tablet 1   No current facility-administered medications on file prior to visit.   Review of Systems All otherwise neg per pt     Objective:   Physical Exam BP 104/64 mmHg  Pulse 85  Temp(Src) 98.6 F (37 C) (Oral)  Ht 5\' 3"  (1.6 m)  Wt 191 lb (86.637 kg)  BMI  33.84 kg/m2  SpO2 99% VS noted,  Constitutional: Pt appears in no significant distress HENT: Head: NCAT.  Right Ear: External ear normal.  Left Ear: External ear normal.  Eyes: . Pupils are equal, round, and reactive to light. Conjunctivae and EOM are normal Bilat tm's without erythema.  Max sinus areas nmon tender.  Pharynx with no erythema, no exudate or swelling Left cheeck/parotid area with 2+ tender, swelling preauricular, with assoc left several tender submandib LA Neck: Normal range of motion. Neck supple.  Cardiovascular: Normal rate and regular rhythm.   Pulmonary/Chest: Effort normal and breath sounds without rales or wheezing.  Neurological: Pt is alert. Not confused , motor grossly intact Skin: Skin is warm. No rash, no LE edema Psychiatric: Pt behavior is normal. No agitation.     Assessment & Plan:

## 2015-02-16 NOTE — Progress Notes (Signed)
Pre visit review using our clinic review tool, if applicable. No additional management support is needed unless otherwise documented below in the visit note. 

## 2015-02-16 NOTE — Patient Instructions (Signed)
Please take all new medication as prescribed - the antibiotic  Please continue all other medications as before, and refills have been done if requested.  Please have the pharmacy call with any other refills you may need.  Please keep your appointments with your specialists as you may have planned   

## 2015-03-27 ENCOUNTER — Encounter: Payer: Self-pay | Admitting: Family Medicine

## 2015-03-27 ENCOUNTER — Other Ambulatory Visit: Payer: Self-pay | Admitting: Family Medicine

## 2015-03-27 ENCOUNTER — Ambulatory Visit (INDEPENDENT_AMBULATORY_CARE_PROVIDER_SITE_OTHER): Payer: 59 | Admitting: Family Medicine

## 2015-03-27 VITALS — BP 110/72 | HR 94 | Temp 98.2°F | Ht 63.0 in | Wt 190.0 lb

## 2015-03-27 DIAGNOSIS — R300 Dysuria: Secondary | ICD-10-CM | POA: Diagnosis not present

## 2015-03-27 LAB — POCT URINALYSIS DIPSTICK
Bilirubin, UA: NEGATIVE
GLUCOSE UA: NEGATIVE
Ketones, UA: NEGATIVE
NITRITE UA: NEGATIVE
PH UA: 6
Protein, UA: POSITIVE
RBC UA: POSITIVE
Spec Grav, UA: 1.025
UROBILINOGEN UA: 0.2

## 2015-03-27 MED ORDER — CIPROFLOXACIN HCL 500 MG PO TABS: 500.0000 mg | ORAL_TABLET | Freq: Two times a day (BID) | ORAL | Status: DC

## 2015-03-27 NOTE — Progress Notes (Signed)
SUBJECTIVE: Jean Blevins is a 54 y.o. female pt of Dr. Sharlet Salina, new to me, who presents to weekend clinic with complainrs of urinary frequency, urgency and dysuria x 2 days, without flank pain, fever, chills, or abnormal vaginal discharge or bleeding.   Current Outpatient Prescriptions on File Prior to Visit  Medication Sig Dispense Refill  . albuterol (VENTOLIN HFA) 108 (90 BASE) MCG/ACT inhaler USE 2 PUFFS EVERY 6 HOURS AS NEEDED FOR WHEEZING. 18 g 11  . amoxicillin-clavulanate (AUGMENTIN) 875-125 MG tablet Take 1 tablet by mouth 2 (two) times daily. 20 tablet 0  . montelukast (SINGULAIR) 10 MG tablet TAKE ONE TABLET AT BEDTIME. 30 tablet 11  . ranitidine (ZANTAC) 150 MG tablet Take 1 tablet (150 mg total) by mouth 2 (two) times daily. 60 tablet 1   No current facility-administered medications on file prior to visit.    No Known Allergies  Past Medical History  Diagnosis Date  . Depression   . Asthma   . Allergic rhinitis   . Palpitations 03/17/2010    Qualifier: Diagnosis of  By: Linda Hedges MD, Heinz Knuckles     Past Surgical History  Procedure Laterality Date  . Abdominal hysterectomy  2008    heavy menstrual bleeding  . Lumbar spine surgery      herniated disc  . Appendectomy    . Cholecystectomy    . Ankle surgery      Family History  Problem Relation Age of Onset  . Cancer Mother     breast  . Diabetes Mother   . Heart disease Father     MI in his 68s  . Hypertension Father   . Hyperlipidemia Father   . Diabetes Maternal Grandmother     Social History   Social History  . Marital Status: Divorced    Spouse Name: N/A  . Number of Children: N/A  . Years of Education: N/A   Occupational History  . Not on file.   Social History Main Topics  . Smoking status: Never Smoker   . Smokeless tobacco: Not on file  . Alcohol Use: No  . Drug Use: Not on file  . Sexual Activity: Not on file   Other Topics Concern  . Not on file   Social History Narrative   Work or  School: medical records      Home Situation: lives with mother      Spiritual Beliefs: beliefs yes, no church      Lifestyle: no regular exercise; diet is so so            The PMH, PSH, Social History, Family History, Medications, and allergies have been reviewed in [facility], and have been updated if relevant.  OBJECTIVE:  BP 110/72 mmHg  Pulse 94  Temp(Src) 98.2 F (36.8 C) (Oral)  Ht 5\' 3"  (1.6 m)  Wt 190 lb (86.183 kg)  BMI 33.67 kg/m2  SpO2 94%  Appears well, in no apparent distress.  Vital signs are normal. The abdomen is soft without tenderness, guarding, mass, rebound or organomegaly. No CVA tenderness or inguinal adenopathy noted. Urine dipstick shows positive for RBC's and positive for leukocytes.    ASSESSMENT: UTI uncomplicated without evidence of pyelonephritis  PLAN: Treatment per orders - cipro 500 twice daily x 3 days, also push fluids, may use Pyridium OTC prn. Call or return to clinic prn if these symptoms worsen or fail to improve as anticipated.

## 2015-03-27 NOTE — Patient Instructions (Signed)

## 2015-03-29 LAB — CULTURE, URINE COMPREHENSIVE

## 2015-03-30 ENCOUNTER — Telehealth: Payer: Self-pay | Admitting: Internal Medicine

## 2015-03-30 NOTE — Telephone Encounter (Signed)
Patient states she seen Dr. Deborra Medina on Saturday and was treated for UTI.  Patient states she now has a possible yeast infection and would like something to be sent over to her pharmacy. I apologized I did not verify pharmacy.

## 2015-03-31 MED ORDER — FLUCONAZOLE 150 MG PO TABS: 150.0000 mg | ORAL_TABLET | Freq: Once | ORAL | Status: DC

## 2015-03-31 NOTE — Telephone Encounter (Signed)
Left message informing patient she something has been sent in.

## 2015-03-31 NOTE — Telephone Encounter (Signed)
Diflucan sent to [facility]. 1 pill take when she gets it that should clear the infection.

## 2015-04-01 ENCOUNTER — Telehealth: Payer: Self-pay | Admitting: Family Medicine

## 2015-04-01 NOTE — Telephone Encounter (Signed)
Patient returned Waynetta's call. °

## 2015-04-01 NOTE — Telephone Encounter (Signed)
See results note. 

## 2015-04-30 ENCOUNTER — Other Ambulatory Visit: Payer: Self-pay | Admitting: Otolaryngology

## 2015-04-30 DIAGNOSIS — D37039 Neoplasm of uncertain behavior of the major salivary glands, unspecified: Secondary | ICD-10-CM

## 2015-05-03 ENCOUNTER — Telehealth: Payer: Self-pay

## 2015-05-03 NOTE — Telephone Encounter (Signed)
Left message to call us back about flu shot.

## 2015-05-14 ENCOUNTER — Inpatient Hospital Stay: Admission: RE | Admit: 2015-05-14 | Payer: Self-pay | Source: Ambulatory Visit

## 2015-06-01 ENCOUNTER — Other Ambulatory Visit: Payer: Self-pay

## 2015-06-10 ENCOUNTER — Ambulatory Visit
Admission: RE | Admit: 2015-06-10 | Discharge: 2015-06-10 | Disposition: A | Discharge status: Home / Self Care | Payer: 59 | Source: Ambulatory Visit | Attending: Otolaryngology | Admitting: Otolaryngology

## 2015-06-10 DIAGNOSIS — D37039 Neoplasm of uncertain behavior of the major salivary glands, unspecified: Secondary | ICD-10-CM

## 2015-06-10 MED ORDER — IOPAMIDOL (ISOVUE-300) INJECTION 61%
75.0000 mL | Freq: Once | INTRAVENOUS | Status: AC | PRN
  Administered 2015-06-10: 75 mL via INTRAVENOUS

## 2015-08-18 ENCOUNTER — Other Ambulatory Visit: Payer: Self-pay | Admitting: Internal Medicine

## 2015-08-19 NOTE — Telephone Encounter (Signed)
i can't tell if patient is being seen by you or pcp at [facility] ??--please advise, thanks

## 2015-08-19 NOTE — Telephone Encounter (Signed)
Left message asking patient to call back to let us know which pcp she is now wanting to see-----let Ronold Hardgrove know when pcp is decided so that rx refill request can either be sent to her pharm or to [facility]

## 2015-08-19 NOTE — Telephone Encounter (Signed)
I have never seen her and do not see any future visits. She is a hopper patient. She needs to decide and schedule transfer visit with provider.

## 2015-08-19 NOTE — Telephone Encounter (Signed)
Pt has an appt with Dr. Sharlet Salina on 09/21/15. Please send in Rx

## 2015-09-21 ENCOUNTER — Encounter: Payer: Self-pay | Admitting: Internal Medicine

## 2015-09-21 ENCOUNTER — Other Ambulatory Visit (INDEPENDENT_AMBULATORY_CARE_PROVIDER_SITE_OTHER): Payer: 59

## 2015-09-21 ENCOUNTER — Ambulatory Visit (INDEPENDENT_AMBULATORY_CARE_PROVIDER_SITE_OTHER): Payer: 59 | Admitting: Internal Medicine

## 2015-09-21 VITALS — BP 122/72 | HR 77 | Temp 99.0°F | Resp 12 | Ht 63.0 in | Wt 185.0 lb

## 2015-09-21 DIAGNOSIS — K760 Fatty (change of) liver, not elsewhere classified: Secondary | ICD-10-CM

## 2015-09-21 DIAGNOSIS — J4520 Mild intermittent asthma, uncomplicated: Secondary | ICD-10-CM

## 2015-09-21 DIAGNOSIS — Z0000 Encounter for general adult medical examination without abnormal findings: Secondary | ICD-10-CM | POA: Diagnosis not present

## 2015-09-21 DIAGNOSIS — Z23 Encounter for immunization: Secondary | ICD-10-CM | POA: Diagnosis not present

## 2015-09-21 DIAGNOSIS — Z1211 Encounter for screening for malignant neoplasm of colon: Secondary | ICD-10-CM

## 2015-09-21 DIAGNOSIS — E669 Obesity, unspecified: Secondary | ICD-10-CM

## 2015-09-21 LAB — COMPREHENSIVE METABOLIC PANEL
ALT: 36 U/L — ABNORMAL HIGH (ref 0–35)
AST: 27 U/L (ref 0–37)
Albumin: 4.4 g/dL (ref 3.5–5.2)
Alkaline Phosphatase: 50 U/L (ref 39–117)
BUN: 18 mg/dL (ref 6–23)
CALCIUM: 10 mg/dL (ref 8.4–10.5)
CHLORIDE: 103 meq/L (ref 96–112)
CO2: 28 meq/L (ref 19–32)
CREATININE: 0.72 mg/dL (ref 0.40–1.20)
GFR: 89.55 mL/min (ref >60.00)
Glucose, Bld: 124 mg/dL — ABNORMAL HIGH (ref 70–99)
Potassium: 3.9 mEq/L (ref 3.5–5.1)
Sodium: 138 mEq/L (ref 135–145)
Total Bilirubin: 0.5 mg/dL (ref 0.2–1.2)
Total Protein: 7.6 g/dL (ref 6.0–8.3)

## 2015-09-21 LAB — LIPID PANEL
CHOL/HDL RATIO: 4
Cholesterol: 157 mg/dL (ref 0–200)
HDL: 41.4 mg/dL (ref >39.00)
LDL CALC: 93 mg/dL (ref 0–99)
NonHDL: 115.43
TRIGLYCERIDES: 114 mg/dL (ref 0.0–149.0)
VLDL: 22.8 mg/dL (ref 0.0–40.0)

## 2015-09-21 LAB — CBC
HCT: 40.2 % (ref 36.0–46.0)
Hemoglobin: 13.7 g/dL (ref 12.0–15.0)
MCHC: 34.2 g/dL (ref 30.0–36.0)
MCV: 88.2 fl (ref 78.0–100.0)
PLATELETS: 251 10*3/uL (ref 150.0–400.0)
RBC: 4.55 Mil/uL (ref 3.87–5.11)
RDW: 13.6 % (ref 11.5–15.5)
WBC: 10.4 10*3/uL (ref 4.0–10.5)

## 2015-09-21 MED ORDER — ALBUTEROL SULFATE HFA 108 (90 BASE) MCG/ACT IN AERS: 2.0000 | INHALATION_SPRAY | Freq: Four times a day (QID) | RESPIRATORY_TRACT | Status: AC | PRN

## 2015-09-21 MED ORDER — MONTELUKAST SODIUM 10 MG PO TABS: ORAL_TABLET | ORAL | Status: DC

## 2015-09-21 NOTE — Assessment & Plan Note (Signed)
She is working on weight loss. Checking labs for complications. Counseled on exercise need.

## 2015-09-21 NOTE — Assessment & Plan Note (Signed)
Checking LFTs, she is working on weight loss.

## 2015-09-21 NOTE — Assessment & Plan Note (Signed)
Mild intermittent, refill singulair and albuterol. Using albuterol 1-2 times per week.

## 2015-09-21 NOTE — Assessment & Plan Note (Signed)
Referral to GI for colonoscopy, tdap given at visit to update immunizations. Needs mammogram and reminded her about that. Checking labs and adjust as needed. Given screening recommendations.

## 2015-09-21 NOTE — Progress Notes (Signed)
   Subjective:    Patient ID: Jean Blevins, female    DOB: Jan 03, 1961, 55 y.o.   MRN: [id number]  HPI The patient is a 55 YO female coming in for wellness. No new concerns.   PMH, [facility], social history reviewed and updated.   Review of Systems  Constitutional: Negative for fever, chills, activity change and fatigue.  HENT: Negative for congestion, postnasal drip, rhinorrhea, sinus pressure and sore throat.   Respiratory: Negative for cough, chest tightness, shortness of breath and wheezing.   Cardiovascular: Negative for chest pain, palpitations and leg swelling.  Gastrointestinal: Negative for vomiting, abdominal pain, diarrhea, constipation and abdominal distention.  Endocrine: Negative.   Musculoskeletal: Negative for arthralgias and gait problem.  Skin: Negative for color change and wound.  Neurological: Negative for dizziness, weakness, light-headedness and headaches.      Objective:   Physical Exam  Constitutional: She is oriented to person, place, and time. She appears well-developed and well-nourished.  Overweight  HENT:  Head: Normocephalic and atraumatic.  Eyes: EOM are normal.  Neck: Normal range of motion. No JVD present.  Cardiovascular: Normal rate and regular rhythm.   No murmur heard. Pulmonary/Chest: Effort normal and breath sounds normal. No respiratory distress. She has no wheezes. She has no rales. She exhibits no tenderness.  Abdominal: Soft. Bowel sounds are normal. She exhibits no distension. There is no tenderness. There is no rebound.  Musculoskeletal: She exhibits no tenderness.  Neurological: She is alert and oriented to person, place, and time. No cranial nerve deficit. Coordination normal.  Skin: Skin is warm and dry.   Filed Vitals:   09/21/15 1514  BP: 122/72  Pulse: 77  Temp: 99 F (37.2 C)  TempSrc: Oral  Resp: 12  Height: 5\' 3"  (1.6 m)  Weight: 185 lb (83.915 kg)  SpO2: 97%      Assessment & Plan:  Tdap given at visit.

## 2015-09-21 NOTE — Patient Instructions (Signed)
We will check the labs today and get you in for the colonoscopy.   We have sent in the refills.   Health Maintenance, Female Adopting a healthy lifestyle and getting preventive care can go a long way to promote health and wellness. Talk with your health care provider about what schedule of regular examinations is right for you. This is a good chance for you to check in with your provider about disease prevention and staying healthy. In between checkups, there are plenty of things you can do on your own. Experts have done a lot of research about which lifestyle changes and preventive measures are most likely to keep you healthy. Ask your health care provider for more information. WEIGHT AND DIET  Eat a healthy diet  Be sure to include plenty of vegetables, fruits, low-fat dairy products, and lean protein.  Do not eat a lot of foods high in solid fats, added sugars, or salt.  Get regular exercise. This is one of the most important things you can do for your health.  Most adults should exercise for at least 150 minutes each week. The exercise should increase your heart rate and make you sweat (moderate-intensity exercise).  Most adults should also do strengthening exercises at least twice a week. This is in addition to the moderate-intensity exercise.  Maintain a healthy weight  Body mass index (BMI) is a measurement that can be used to identify possible weight problems. It estimates body fat based on height and weight. Your health care provider can help determine your BMI and help you achieve or maintain a healthy weight.  For females 15 years of age and older:   A BMI below 18.5 is considered underweight.  A BMI of 18.5 to 24.9 is normal.  A BMI of 25 to 29.9 is considered overweight.  A BMI of 30 and above is considered obese.  Watch levels of cholesterol and blood lipids  You should start having your blood tested for lipids and cholesterol at 55 years of age, then have this test  every 5 years.  You may need to have your cholesterol levels checked more often if:  Your lipid or cholesterol levels are high.  You are older than 55 years of age.  You are at high risk for heart disease.  CANCER SCREENING   Lung Cancer  Lung cancer screening is recommended for adults 27-35 years old who are at high risk for lung cancer because of a history of smoking.  A yearly low-dose CT scan of the lungs is recommended for people who:  Currently smoke.  Have quit within the past 15 years.  Have at least a 30-pack-year history of smoking. A pack year is smoking an average of one pack of cigarettes a day for 1 year.  Yearly screening should continue until it has been 15 years since you quit.  Yearly screening should stop if you develop a health problem that would prevent you from having lung cancer treatment.  Breast Cancer  Practice breast self-awareness. This means understanding how your breasts normally appear and feel.  It also means doing regular breast self-exams. Let your health care provider know about any changes, no matter how small.  If you are in your 20s or 30s, you should have a clinical breast exam (CBE) by a health care provider every 1-3 years as part of a regular health exam.  If you are 32 or older, have a CBE every year. Also consider having a breast X-ray (mammogram) every  year.  If you have a family history of breast cancer, talk to your health care provider about genetic screening.  If you are at high risk for breast cancer, talk to your health care provider about having an MRI and a mammogram every year.  Breast cancer gene (BRCA) assessment is recommended for women who have family members with BRCA-related cancers. BRCA-related cancers include:  Breast.  Ovarian.  Tubal.  Peritoneal cancers.  Results of the assessment will determine the need for genetic counseling and BRCA1 and BRCA2 testing. Cervical Cancer Your health care provider  may recommend that you be screened regularly for cancer of the pelvic organs (ovaries, uterus, and vagina). This screening involves a pelvic examination, including checking for microscopic changes to the surface of your cervix (Pap test). You may be encouraged to have this screening done every 3 years, beginning at age 71.  For women ages 68-65, health care providers may recommend pelvic exams and Pap testing every 3 years, or they may recommend the Pap and pelvic exam, combined with testing for human papilloma virus (HPV), every 5 years. Some types of HPV increase your risk of cervical cancer. Testing for HPV may also be done on women of any age with unclear Pap test results.  Other health care providers may not recommend any screening for nonpregnant women who are considered low risk for pelvic cancer and who do not have symptoms. Ask your health care provider if a screening pelvic exam is right for you.  If you have had past treatment for cervical cancer or a condition that could lead to cancer, you need Pap tests and screening for cancer for at least 20 years after your treatment. If Pap tests have been discontinued, your risk factors (such as having a new sexual partner) need to be reassessed to determine if screening should resume. Some women have medical problems that increase the chance of getting cervical cancer. In these cases, your health care provider may recommend more frequent screening and Pap tests. Colorectal Cancer  This type of cancer can be detected and often prevented.  Routine colorectal cancer screening usually begins at 55 years of age and continues through 55 years of age.  Your health care provider may recommend screening at an earlier age if you have risk factors for colon cancer.  Your health care provider may also recommend using home test kits to check for hidden blood in the stool.  A small camera at the end of a tube can be used to examine your colon directly  (sigmoidoscopy or colonoscopy). This is done to check for the earliest forms of colorectal cancer.  Routine screening usually begins at age 25.  Direct examination of the colon should be repeated every 5-10 years through 55 years of age. However, you may need to be screened more often if early forms of precancerous polyps or small growths are found. Skin Cancer  Check your skin from head to toe regularly.  Tell your health care provider about any new moles or changes in moles, especially if there is a change in a mole's shape or color.  Also tell your health care provider if you have a mole that is larger than the size of a pencil eraser.  Always use sunscreen. Apply sunscreen liberally and repeatedly throughout the day.  Protect yourself by wearing long sleeves, pants, a wide-brimmed hat, and sunglasses whenever you are outside. HEART DISEASE, DIABETES, AND HIGH BLOOD PRESSURE   High blood pressure causes heart disease and increases  the risk of stroke. High blood pressure is more likely to develop in:  People who have blood pressure in the high end of the normal range (130-139/85-89 mm Hg).  People who are overweight or obese.  People who are African American.  If you are 45-69 years of age, have your blood pressure checked every 3-5 years. If you are 72 years of age or older, have your blood pressure checked every year. You should have your blood pressure measured twice--once when you are at a hospital or clinic, and once when you are not at a hospital or clinic. Record the average of the two measurements. To check your blood pressure when you are not at a hospital or clinic, you can use:  An automated blood pressure machine at a pharmacy.  A home blood pressure monitor.  If you are between 88 years and 45 years old, ask your health care provider if you should take aspirin to prevent strokes.  Have regular diabetes screenings. This involves taking a blood sample to check your  fasting blood sugar level.  If you are at a normal weight and have a low risk for diabetes, have this test once every three years after 55 years of age.  If you are overweight and have a high risk for diabetes, consider being tested at a younger age or more often. PREVENTING INFECTION  Hepatitis B  If you have a higher risk for hepatitis B, you should be screened for this virus. You are considered at high risk for hepatitis B if:  You were born in a country where hepatitis B is common. Ask your health care provider which countries are considered high risk.  Your parents were born in a high-risk country, and you have not been immunized against hepatitis B (hepatitis B vaccine).  You have HIV or AIDS.  You use needles to inject street drugs.  You live with someone who has hepatitis B.  You have had sex with someone who has hepatitis B.  You get hemodialysis treatment.  You take certain medicines for conditions, including cancer, organ transplantation, and autoimmune conditions. Hepatitis C  Blood testing is recommended for:  Everyone born from [birth year] through 1965.  Anyone with known risk factors for hepatitis C. Sexually transmitted infections (STIs)  You should be screened for sexually transmitted infections (STIs) including gonorrhea and chlamydia if:  You are sexually active and are younger than 55 years of age.  You are older than 56 years of age and your health care provider tells you that you are at risk for this type of infection.  Your sexual activity has changed since you were last screened and you are at an increased risk for chlamydia or gonorrhea. Ask your health care provider if you are at risk.  If you do not have HIV, but are at risk, it may be recommended that you take a prescription medicine daily to prevent HIV infection. This is called pre-exposure prophylaxis (PrEP). You are considered at risk if:  You are sexually active and do not regularly use condoms or  know the HIV status of your partner(s).  You take drugs by injection.  You are sexually active with a partner who has HIV. Talk with your health care provider about whether you are at high risk of being infected with HIV. If you choose to begin PrEP, you should first be tested for HIV. You should then be tested every 3 months for as long as you are taking PrEP.  PREGNANCY  If you are premenopausal and you may become pregnant, ask your health care provider about preconception counseling.  If you may become pregnant, take 400 to 800 micrograms (mcg) of folic acid every day.  If you want to prevent pregnancy, talk to your health care provider about birth control (contraception). OSTEOPOROSIS AND MENOPAUSE   Osteoporosis is a disease in which the bones lose minerals and strength with aging. This can result in serious bone fractures. Your risk for osteoporosis can be identified using a bone density scan.  If you are 36 years of age or older, or if you are at risk for osteoporosis and fractures, ask your health care provider if you should be screened.  Ask your health care provider whether you should take a calcium or vitamin D supplement to lower your risk for osteoporosis.  Menopause may have certain physical symptoms and risks.  Hormone replacement therapy may reduce some of these symptoms and risks. Talk to your health care provider about whether hormone replacement therapy is right for you.  HOME CARE INSTRUCTIONS   Schedule regular health, dental, and eye exams.  Stay current with your immunizations.   Do not use any tobacco products including cigarettes, chewing tobacco, or electronic cigarettes.  If you are pregnant, do not drink alcohol.  If you are breastfeeding, limit how much and how often you drink alcohol.  Limit alcohol intake to no more than 1 drink per day for nonpregnant women. One drink equals 12 ounces of beer, 5 ounces of wine, or 1 ounces of hard liquor.  Do  not use street drugs.  Do not share needles.  Ask your health care provider for help if you need support or information about quitting drugs.  Tell your health care provider if you often feel depressed.  Tell your health care provider if you have ever been abused or do not feel safe at home.   This information is not intended to replace advice given to you by your health care provider. Make sure you discuss any questions you have with your health care provider.   Document Released: 11/21/2010 Document Revised: 05/29/2014 Document Reviewed: 04/09/2013 Elsevier Interactive Patient Education [employer].

## 2015-09-21 NOTE — Progress Notes (Signed)
Pre visit review using our clinic review tool, if applicable. No additional management support is needed unless otherwise documented below in the visit note. 

## 2015-10-19 ENCOUNTER — Ambulatory Visit (INDEPENDENT_AMBULATORY_CARE_PROVIDER_SITE_OTHER): Payer: 59 | Admitting: Internal Medicine

## 2015-10-19 ENCOUNTER — Encounter: Payer: Self-pay | Admitting: Internal Medicine

## 2015-10-19 VITALS — BP 122/76 | HR 115 | Temp 99.7°F | Resp 18 | Ht 63.0 in | Wt 182.0 lb

## 2015-10-19 DIAGNOSIS — B349 Viral infection, unspecified: Secondary | ICD-10-CM | POA: Diagnosis not present

## 2015-10-19 MED ORDER — PREDNISONE 20 MG PO TABS
40.0000 mg | ORAL_TABLET | Freq: Every day | ORAL | Status: DC
Start: 2015-10-19 — End: 2015-12-10

## 2015-10-19 MED ORDER — HYDROCODONE-HOMATROPINE 5-1.5 MG/5ML PO SYRP
5.0000 mL | ORAL_SOLUTION | Freq: Three times a day (TID) | ORAL | Status: DC | PRN
Start: 2015-10-19 — End: 2015-12-10

## 2015-10-19 NOTE — Patient Instructions (Signed)
We would recommend to start taking zyrtec 1 pill twice a day. We have given you a cough syrup that you can use for cough, do not drive after taking until you know how it will affect you as it can make you sleepy or drowsy.   For the muscle aches you can use ibuprofen or naproxen.   Be sure to stay well hydrated to help the muscles feel better sooner.   Keep using the albuterol as needed.   We have sent in prednisone so if the breathing gets worse start taking it. If you are not feeling better at all by Thursday start taking it. Take 2 pills daily for 5 days.   Upper Respiratory Infection, Adult Most upper respiratory infections (URIs) are a viral infection of the air passages leading to the lungs. A URI affects the nose, throat, and upper air passages. The most common type of URI is nasopharyngitis and is typically referred to as "the common cold." URIs run their course and usually go away on their own. Most of the time, a URI does not require medical attention, but sometimes a bacterial infection in the upper airways can follow a viral infection. This is called a secondary infection. Sinus and middle ear infections are common types of secondary upper respiratory infections. Bacterial pneumonia can also complicate a URI. A URI can worsen asthma and chronic obstructive pulmonary disease (COPD). Sometimes, these complications can require emergency medical care and may be life threatening.  CAUSES Almost all URIs are caused by viruses. A virus is a type of germ and can spread from one person to another.  RISKS FACTORS You may be at risk for a URI if:   You smoke.   You have chronic heart or lung disease.  You have a weakened defense (immune) system.   You are very young or very old.   You have nasal allergies or asthma.  You work in crowded or poorly ventilated areas.  You work in health care facilities or schools. SIGNS AND SYMPTOMS  Symptoms typically develop 2-3 days after you  come in contact with a cold virus. Most viral URIs last 7-10 days. However, viral URIs from the influenza virus (flu virus) can last 14-18 days and are typically more severe. Symptoms may include:   Runny or stuffy (congested) nose.   Sneezing.   Cough.   Sore throat.   Headache.   Fatigue.   Fever.   Loss of appetite.   Pain in your forehead, behind your eyes, and over your cheekbones (sinus pain).  Muscle aches.  DIAGNOSIS  Your health care provider may diagnose a URI by:  Physical exam.  Tests to check that your symptoms are not due to another condition such as:  Strep throat.  Sinusitis.  Pneumonia.  Asthma. TREATMENT  A URI goes away on its own with time. It cannot be cured with medicines, but medicines may be prescribed or recommended to relieve symptoms. Medicines may help:  Reduce your fever.  Reduce your cough.  Relieve nasal congestion. HOME CARE INSTRUCTIONS   Take medicines only as directed by your health care provider.   Gargle warm saltwater or take cough drops to comfort your throat as directed by your health care provider.  Use a warm mist humidifier or inhale steam from a shower to increase air moisture. This may make it easier to breathe.  Drink enough fluid to keep your urine clear or pale yellow.   Eat soups and other clear broths and  maintain good nutrition.   Rest as needed.   Return to work when your temperature has returned to normal or as your health care provider advises. You may need to stay home longer to avoid infecting others. You can also use a face mask and careful hand washing to prevent spread of the virus.  Increase the usage of your inhaler if you have asthma.   Do not use any tobacco products, including cigarettes, chewing tobacco, or electronic cigarettes. If you need help quitting, ask your health care provider. PREVENTION  The best way to protect yourself from getting a cold is to practice good  hygiene.   Avoid oral or hand contact with people with cold symptoms.   Wash your hands often if contact occurs.  There is no clear evidence that vitamin C, vitamin E, echinacea, or exercise reduces the chance of developing a cold. However, it is always recommended to get plenty of rest, exercise, and practice good nutrition.  SEEK MEDICAL CARE IF:   You are getting worse rather than better.   Your symptoms are not controlled by medicine.   You have chills.  You have worsening shortness of breath.  You have brown or red mucus.  You have yellow or brown nasal discharge.  You have pain in your face, especially when you bend forward.  You have a fever.  You have swollen neck glands.  You have pain while swallowing.  You have white areas in the back of your throat. SEEK IMMEDIATE MEDICAL CARE IF:   You have severe or persistent:  Headache.  Ear pain.  Sinus pain.  Chest pain.  You have chronic lung disease and any of the following:  Wheezing.  Prolonged cough.  Coughing up blood.  A change in your usual mucus.  You have a stiff neck.  You have changes in your:  Vision.  Hearing.  Thinking.  Mood. MAKE SURE YOU:   Understand these instructions.  Will watch your condition.  Will get help right away if you are not doing well or get worse.   This information is not intended to replace advice given to you by your health care provider. Make sure you discuss any questions you have with your health care provider.   Document Released: 11/01/2000 Document Revised: 09/22/2014 Document Reviewed: 08/13/2013 Elsevier Interactive Patient Education 2016 [employer]  Viral Infections A virus is a type of germ. Viruses can cause:  Minor sore throats.  Aches and pains.  Headaches.  Runny nose.  Rashes.  Watery eyes.  Tiredness.  Coughs.  Loss of appetite.  Feeling sick to your stomach (nausea).  Throwing up (vomiting).  Watery poop  (diarrhea). HOME CARE   Only take medicines as told by your doctor.  Drink enough water and fluids to keep your pee (urine) clear or pale yellow. Sports drinks are a good choice.  Get plenty of rest and eat healthy. Soups and broths with crackers or rice are fine. GET HELP RIGHT AWAY IF:   You have a very bad headache.  You have shortness of breath.  You have chest pain or neck pain.  You have an unusual rash.  You cannot stop throwing up.  You have watery poop that does not stop.  You cannot keep fluids down.  You or your child has a temperature by mouth above 102 F (38.9 C), not controlled by medicine.  Your baby is older than 3 months with a rectal temperature of 102 F (38.9 C) or higher.  Your baby is 39 months old or younger with a rectal temperature of 100.4 F (38 C) or higher. MAKE SURE YOU:   Understand these instructions.  Will watch this condition.  Will get help right away if you are not doing well or get worse.   This information is not intended to replace advice given to you by your health care provider. Make sure you discuss any questions you have with your health care provider.   Document Released: 04/20/2008 Document Revised: 07/31/2011 Document Reviewed: 10/14/2014 Elsevier Interactive Patient Education [employer].

## 2015-10-19 NOTE — Progress Notes (Signed)
Pre visit review using our clinic review tool, if applicable. No additional management support is needed unless otherwise documented below in the visit note. 

## 2015-10-19 NOTE — Assessment & Plan Note (Signed)
No antibiotics indicated, no significant wheezing in the lungs. Rx for hycodan cough syrup and recommended she add zyrtec. Can take ibuprofen for muscle aches. Rx for prednisone given in case the breathing does not improve or worsens. She has albuterol which she will continue using as needed.

## 2015-10-19 NOTE — Progress Notes (Signed)
   Subjective:    Patient ID: Jean Blevins, female    DOB: 1961/02/15, 55 y.o.   MRN: [id number]  HPI The patient is a 55 YO female coming in for sickness. Started Sunday night, having fevers and chills. Having some nasal drainage as well and cough which is mostly non-productive. She is using her albuterol inhaler more often the last day or so. Also having severe muscle aches. Has not taken or tried anything for it. No one has been sick around her. Overall same since onset.   Review of Systems  Constitutional: Positive for fever, chills, activity change and fatigue.  HENT: Positive for congestion, postnasal drip and rhinorrhea. Negative for ear discharge, ear pain, sinus pressure, sore throat and trouble swallowing.   Respiratory: Positive for cough and shortness of breath. Negative for chest tightness and wheezing.   Cardiovascular: Negative for chest pain, palpitations and leg swelling.  Gastrointestinal: Negative for vomiting, abdominal pain, diarrhea, constipation and abdominal distention.  Musculoskeletal: Positive for myalgias. Negative for arthralgias and gait problem.  Skin: Negative for color change and wound.      Objective:   Physical Exam  Constitutional: She is oriented to person, place, and time. She appears well-developed and well-nourished.  Overweight  HENT:  Head: Normocephalic and atraumatic.  Right Ear: External ear normal.  Left Ear: External ear normal.  Oropharynx with redness and clear drainage, nasal turbinates with redness and swelling.   Eyes: EOM are normal.  Neck: Normal range of motion. No JVD present.  Cardiovascular: Normal rate and regular rhythm.   Pulmonary/Chest: Effort normal. No respiratory distress. She has no rales. She exhibits no tenderness.  Mild wheeze with coughing. No rhonchi or rales  Abdominal: Soft. She exhibits no distension. There is no tenderness.  Lymphadenopathy:    She has no cervical adenopathy.  Neurological: She is alert and  oriented to person, place, and time. Coordination normal.   Filed Vitals:   10/19/15 1041  BP: 122/76  Pulse: 115  Temp: 99.7 F (37.6 C)  TempSrc: Oral  Resp: 18  Height: 5\' 3"  (1.6 m)  Weight: 182 lb (82.555 kg)  SpO2: 97%      Assessment & Plan:

## 2015-10-21 ENCOUNTER — Encounter: Payer: Self-pay | Admitting: Internal Medicine

## 2015-12-10 ENCOUNTER — Ambulatory Visit (AMBULATORY_SURGERY_CENTER): Payer: Self-pay | Admitting: *Deleted

## 2015-12-10 VITALS — Ht 63.0 in | Wt 185.0 lb

## 2015-12-10 DIAGNOSIS — Z1211 Encounter for screening for malignant neoplasm of colon: Secondary | ICD-10-CM

## 2015-12-10 MED ORDER — NA SULFATE-K SULFATE-MG SULF 17.5-3.13-1.6 GM/177ML PO SOLN: 1.0000 | Freq: Once | ORAL | Status: DC

## 2015-12-10 NOTE — Progress Notes (Signed)
No egg or soy allergy known to patient  No issues with past sedation with any surgeries  or procedures, no intubation problems  No diet pills per patient No home 02 use per patient  No blood thinners per patient  Pt denies issues with constipation   

## 2015-12-15 ENCOUNTER — Encounter: Payer: Self-pay | Admitting: Internal Medicine

## 2015-12-24 ENCOUNTER — Encounter: Payer: Self-pay | Admitting: Internal Medicine

## 2015-12-24 ENCOUNTER — Ambulatory Visit (AMBULATORY_SURGERY_CENTER): Payer: 59 | Admitting: Internal Medicine

## 2015-12-24 VITALS — BP 114/69 | HR 65 | Temp 98.7°F | Resp 12 | Ht 63.0 in | Wt 185.0 lb

## 2015-12-24 DIAGNOSIS — D123 Benign neoplasm of transverse colon: Secondary | ICD-10-CM

## 2015-12-24 DIAGNOSIS — D124 Benign neoplasm of descending colon: Secondary | ICD-10-CM | POA: Diagnosis not present

## 2015-12-24 DIAGNOSIS — Z1211 Encounter for screening for malignant neoplasm of colon: Secondary | ICD-10-CM

## 2015-12-24 DIAGNOSIS — K635 Polyp of colon: Secondary | ICD-10-CM

## 2015-12-24 DIAGNOSIS — D125 Benign neoplasm of sigmoid colon: Secondary | ICD-10-CM

## 2015-12-24 MED ORDER — SODIUM CHLORIDE 0.9 % IV SOLN: 500.0000 mL | INTRAVENOUS | Status: DC

## 2015-12-24 NOTE — Progress Notes (Signed)
To recovery, report to [place], RN, VSS 

## 2015-12-24 NOTE — Progress Notes (Signed)
Called to room to assist during endoscopic procedure.  Patient ID and intended procedure confirmed with present staff. Received instructions for my participation in the procedure from the performing physician.  

## 2015-12-24 NOTE — Op Note (Signed)
[facility] Patient Name: Jean Blevins Procedure Date: 12/24/2015 9:06 AM MRN: [id number] Endoscopist: Docia Chuck. Henrene Pastor , MD Age: 55 Referring MD:  Date of Birth: Aug 17, 1960 Gender: Female Account #: [id number] Procedure:                Colonoscopy, with cold snare polypectomy X4 Indications:              Screening for colorectal malignant neoplasm Medicines:                Monitored Anesthesia Care Procedure:                Pre-Anesthesia Assessment:                           - Prior to the procedure, a History and Physical                            was performed, and patient medications and                            allergies were reviewed. The patient's tolerance of                            previous anesthesia was also reviewed. The risks                            and benefits of the procedure and the sedation                            options and risks were discussed with the patient.                            All questions were answered, and informed consent                            was obtained. Prior Anticoagulants: The patient has                            taken no previous anticoagulant or antiplatelet                            agents. ASA Grade Assessment: I - A normal, healthy                            patient. After reviewing the risks and benefits,                            the patient was deemed in satisfactory condition to                            undergo the procedure.                           After obtaining informed consent, the colonoscope  was passed under direct vision. Throughout the                            procedure, the patient's blood pressure, pulse, and                            oxygen saturations were monitored continuously. The                            Model CF-HQ190L [id number]) scope was introduced                            through the anus and advanced to the the cecum,   identified by the appendiceal orifice, ileocecal                            valve and palpation. The ileocecal valve,                            appendiceal orifice, and rectum were photographed.                            The quality of the bowel preparation was excellent.                            The colonoscopy was performed without difficulty.                            The patient tolerated the procedure well. The bowel                            preparation used was SUPREP. Scope In: 9:14:13 AM Scope Out: 9:30:21 AM Scope Withdrawal Time: 0 hours 11 minutes 59 seconds  Total Procedure Duration: 0 hours 16 minutes 8 seconds  Findings:                 Four polyps were found in the sigmoid colon,                            descending colon and transverse colon. The polyps                            were 2 to 4 mm in size. These polyps were removed                            with a cold snare. Resection and retrieval were                            complete.                           A few small-mouthed diverticula were found in the  sigmoid colon.                           The exam was otherwise without abnormality on                            direct and retroflexion views. Complications:            No immediate complications. Estimated blood loss:                            None. Estimated Blood Loss:     Estimated blood loss: none. Impression:               - Four 2 to 4 mm polyps in the sigmoid colon, in                            the descending colon and in the transverse colon,                            removed with a cold snare. Resected and retrieved.                           - Diverticulosis in the sigmoid colon.                           - The examination was otherwise normal on direct                            and retroflexion views. Recommendation:           - Repeat colonoscopy in 3 - 5 years for                            surveillance.                            - Patient has a contact number available for                            emergencies. The signs and symptoms of potential                            delayed complications were discussed with the                            patient. Return to normal activities tomorrow.                            Written discharge instructions were provided to the                            patient.                           - Resume previous diet.                           -  Continue present medications.                           - Await pathology results. Docia Chuck. Henrene Pastor, MD 12/24/2015 9:34:50 AM This report has been signed electronically.

## 2015-12-24 NOTE — Patient Instructions (Addendum)
YOU HAD AN ENDOSCOPIC PROCEDURE TODAY AT [facility]:   Refer to the procedure report that was given to you for any specific questions about what was found during the examination.  If the procedure report does not answer your questions, please call your gastroenterologist to clarify.  If you requested that your care partner not be given the details of your procedure findings, then the procedure report has been included in a sealed envelope for you to review at your convenience later.  YOU SHOULD EXPECT: Some feelings of bloating in the abdomen. Passage of more gas than usual.  Walking can help get rid of the air that was put into your GI tract during the procedure and reduce the bloating. If you had a lower endoscopy (such as a colonoscopy or flexible sigmoidoscopy) you may notice spotting of blood in your stool or on the toilet paper. If you underwent a bowel prep for your procedure, you may not have a normal bowel movement for a few days.  Please Note:  You might notice some irritation and congestion in your nose or some drainage.  This is from the oxygen used during your procedure.  There is no need for concern and it should clear up in a day or so.  SYMPTOMS TO REPORT IMMEDIATELY:   Following lower endoscopy (colonoscopy or flexible sigmoidoscopy):  Excessive amounts of blood in the stool  Significant tenderness or worsening of abdominal pains  Swelling of the abdomen that is new, acute  Fever of 100F or higher    For urgent or emergent issues, a gastroenterologist can be reached at any hour by calling [phone].   DIET: Your first meal following the procedure should be a small meal and then it is ok to progress to your normal diet. Heavy or fried foods are harder to digest and may make you feel nauseous or bloated.  Likewise, meals heavy in dairy and vegetables can increase bloating.  Drink plenty of fluids but you should avoid alcoholic beverages for 24  hours.  ACTIVITY:  You should plan to take it easy for the rest of today and you should NOT DRIVE or use heavy machinery until tomorrow (because of the sedation medicines used during the test).    FOLLOW UP: Our staff will call the number listed on your records the next business day following your procedure to check on you and address any questions or concerns that you may have regarding the information given to you following your procedure. If we do not reach you, we will leave a message.  However, if you are feeling well and you are not experiencing any problems, there is no need to return our call.  We will assume that you have returned to your regular daily activities without incident.  If any biopsies were taken you will be contacted by phone or by letter within the next 1-3 weeks.  Please call us at [phone] if you have not heard about the biopsies in 3 weeks.    SIGNATURES/CONFIDENTIALITY: You and/or your care partner have signed paperwork which will be entered into your electronic medical record.  These signatures attest to the fact that that the information above on your After Visit Summary has been reviewed and is understood.  Full responsibility of the confidentiality of this discharge information lies with you and/or your care-partner.   INFORMATION ON POLYPS,DIVERTICULOSIS AND HEMORRHOIDS ,AND HIGH FIBER DIET GIVEN TO YOU TODAY

## 2015-12-27 ENCOUNTER — Telehealth: Payer: Self-pay

## 2015-12-27 NOTE — Telephone Encounter (Signed)
  Follow up Call-  Call back number 12/24/2015  Post procedure Call Back phone  # [phone]  Permission to leave phone message Yes  Some recent data might be hidden     Patient questions:  Do you have a fever, pain , or abdominal swelling? No. Pain Score  0 *  Have you tolerated food without any problems? Yes.    Have you been able to return to your normal activities? Yes.    Do you have any questions about your discharge instructions: Diet   No. Medications  No. Follow up visit  No.  Do you have questions or concerns about your Care? No.  Actions: * If pain score is 4 or above: No action needed, pain <4.

## 2016-01-04 ENCOUNTER — Encounter: Payer: Self-pay | Admitting: Internal Medicine

## 2016-03-03 ENCOUNTER — Ambulatory Visit (INDEPENDENT_AMBULATORY_CARE_PROVIDER_SITE_OTHER): Payer: 59 | Admitting: Adult Health

## 2016-03-03 ENCOUNTER — Encounter: Payer: Self-pay | Admitting: Adult Health

## 2016-03-03 VITALS — BP 128/80 | Temp 98.4°F | Ht 63.0 in | Wt 186.4 lb

## 2016-03-03 DIAGNOSIS — R399 Unspecified symptoms and signs involving the genitourinary system: Secondary | ICD-10-CM

## 2016-03-03 LAB — POCT URINALYSIS DIPSTICK
Bilirubin, UA: NEGATIVE
Glucose, UA: NEGATIVE
KETONES UA: NEGATIVE
Leukocytes, UA: NEGATIVE
Nitrite, UA: NEGATIVE
PH UA: 6
PROTEIN UA: NEGATIVE
SPEC GRAV UA: 1.01
Urobilinogen, UA: 0.2

## 2016-03-03 MED ORDER — FLUCONAZOLE 150 MG PO TABS: 150.0000 mg | ORAL_TABLET | Freq: Once | ORAL | 1 refills | Status: AC

## 2016-03-03 NOTE — Progress Notes (Signed)
Pre visit review using our clinic review tool, if applicable. No additional management support is needed unless otherwise documented below in the visit note. 

## 2016-03-03 NOTE — Progress Notes (Signed)
Subjective:    Patient ID: Jean Blevins, female    DOB: [birth year], 55 y.o.   MRN: [id number]  HPI  55 year old female who presents to the office today with the complaint possible dysuria/and or vaginal burning. She was recently treated for a UTI and subsequent yeast infection. She is unsure if she has a UTI returning or if her yeast infection was not treated fully.   She denies any back pain, urinary frequency, urinary urgency, fevers, or vaginal discharge.   Review of Systems  Constitutional: Negative.   Respiratory: Negative.   Cardiovascular: Negative.   Gastrointestinal: Negative.   Genitourinary: Positive for dysuria (?) and vaginal pain (?). Negative for decreased urine volume, difficulty urinating, dyspareunia, flank pain, frequency, menstrual problem, pelvic pain, urgency, vaginal bleeding and vaginal discharge.  Musculoskeletal: Negative.   Neurological: Negative.   Hematological: Negative.   All other systems reviewed and are negative.  Past Medical History:  Diagnosis Date  . Allergic rhinitis   . Allergy   . Arthritis    knees,back  . Asthma   . Cataract    starting  . Depression   . Palpitations 03/17/2010   Qualifier: Diagnosis of  By: Linda Hedges MD, Heinz Knuckles     Social History   Social History  . Marital status: Divorced    Spouse name: N/A  . Number of children: N/A  . Years of education: N/A   Occupational History  . Not on file.   Social History Main Topics  . Smoking status: Never Smoker  . Smokeless tobacco: Never Used  . Alcohol use 0.0 oz/week     Comment: very rare   . Drug use: No  . Sexual activity: Not on file   Other Topics Concern  . Not on file   Social History Narrative   Work or School: medical records      Home Situation: lives with mother      Spiritual Beliefs: beliefs yes, no church      Lifestyle: no regular exercise; diet is so so             Past Surgical History:  Procedure Laterality Date  . ABDOMINAL  HYSTERECTOMY  2008   heavy menstrual bleeding  . ANKLE SURGERY    . APPENDECTOMY    . CHOLECYSTECTOMY    . LUMBAR SPINE SURGERY     herniated disc    Family History  Problem Relation Age of Onset  . Cancer Mother     breast  . Diabetes Mother   . Heart disease Father     MI in his 20s  . Hypertension Father   . Hyperlipidemia Father   . Diabetes Maternal Grandmother   . Colon cancer Neg Hx   . Colon polyps Neg Hx   . Esophageal cancer Neg Hx   . Rectal cancer Neg Hx   . Stomach cancer Neg Hx     No Known Allergies  Current Outpatient Prescriptions on File Prior to Visit  Medication Sig Dispense Refill  . albuterol (PROVENTIL HFA;VENTOLIN HFA) 108 (90 Base) MCG/ACT inhaler Inhale 2 puffs into the lungs every 6 (six) hours as needed for wheezing or shortness of breath. 1 Inhaler 2  . montelukast (SINGULAIR) 10 MG tablet TAKE ONE TABLET AT BEDTIME. 90 tablet 3   Current Facility-Administered Medications on File Prior to Visit  Medication Dose Route Frequency Provider Last Rate Last Dose  . 0.9 %  sodium chloride infusion  500 mL Intravenous Continuous  Irene Shipper, MD        BP 128/80   Temp 98.4 F (36.9 C) (Oral)   Ht 5\' 3"  (1.6 m)   Wt 186 lb 6 oz (84.5 kg)   BMI 33.01 kg/m       Objective:   Physical Exam  Constitutional: She is oriented to person, place, and time. She appears well-developed and well-nourished. No distress.  Cardiovascular: Normal rate, regular rhythm, normal heart sounds and intact distal pulses.  Exam reveals no gallop and no friction rub.   No murmur heard. Pulmonary/Chest: Effort normal and breath sounds normal. No respiratory distress. She has no wheezes. She has no rales. She exhibits no tenderness.  Abdominal: Soft. Bowel sounds are normal. She exhibits no distension and no mass. There is no tenderness. There is no rebound and no guarding.  Neurological: She is alert and oriented to person, place, and time.  Skin: Skin is warm and dry.  No rash noted. She is not diaphoretic. No erythema. No pallor.  Psychiatric: She has a normal mood and affect. Her behavior is normal. Thought content normal.  Nursing note and vitals reviewed.     Assessment & Plan:  1. UTI symptoms - POCT urinalysis dipstick - negative for leuks or nitrates. Trace blood.  - fluconazole (DIFLUCAN) 150 MG tablet; Take 1 tablet (150 mg total) by mouth once.  Dispense: 1 tablet; Refill: 1 - Will treat for yeast infection with diflucan  - Follow up with PCP for any additional issues  Dorothyann Peng, NP

## 2016-03-21 ENCOUNTER — Encounter: Payer: Self-pay | Admitting: Internal Medicine

## 2016-03-21 ENCOUNTER — Ambulatory Visit (INDEPENDENT_AMBULATORY_CARE_PROVIDER_SITE_OTHER)
Admission: RE | Admit: 2016-03-21 | Discharge: 2016-03-21 | Disposition: A | Discharge status: Home / Self Care | Payer: 59 | Source: Ambulatory Visit | Attending: Internal Medicine | Admitting: Internal Medicine

## 2016-03-21 ENCOUNTER — Ambulatory Visit (INDEPENDENT_AMBULATORY_CARE_PROVIDER_SITE_OTHER): Payer: 59 | Admitting: Internal Medicine

## 2016-03-21 VITALS — BP 122/82 | HR 80 | Temp 98.2°F | Wt 184.0 lb

## 2016-03-21 DIAGNOSIS — M25552 Pain in left hip: Secondary | ICD-10-CM

## 2016-03-21 DIAGNOSIS — Z23 Encounter for immunization: Secondary | ICD-10-CM | POA: Diagnosis not present

## 2016-03-21 MED ORDER — PREDNISONE 20 MG PO TABS: 40.0000 mg | ORAL_TABLET | Freq: Every day | ORAL | 0 refills | Status: DC

## 2016-03-21 NOTE — Patient Instructions (Signed)
We have sent in the prednisone to take 2 pills daily for 5 days.   We are checking the x-ray today and will call back with the results.    Trochanteric Bursitis You have hip pain due to trochanteric bursitis. Bursitis means that the sack near the outside of the hip is filled with fluid and inflamed. This sack is made up of protective soft tissue. The pain from trochanteric bursitis can be severe and keep you from sleep. It can radiate to the buttocks or down the outside of the thigh to the knee. The pain is almost always worse when rising from the seated or lying position and with walking. Pain can improve after you take a few steps. It happens more often in people with hip joint and lumbar spine problems, such as arthritis or previous surgery. Very rarely the trochanteric bursa can become infected, and antibiotics and/or surgery may be needed. Treatment often includes an injection of local anesthetic mixed with cortisone medicine. This medicine is injected into the area where it is most tender over the hip. Repeat injections may be necessary if the response to treatment is slow. You can apply ice packs over the tender area for 30 minutes every 2 hours for the next few days. Anti-inflammatory and/or narcotic pain medicine may also be helpful. Limit your activity for the next few days if the pain continues. See your caregiver in 5-10 days if you are not greatly improved.  SEEK IMMEDIATE MEDICAL CARE IF:  You develop severe pain, fever, or increased redness.  You have pain that radiates below the knee. EXERCISES STRETCHING EXERCISES - Trochanteric Bursitis  These exercises may help you when beginning to rehabilitate your injury. Your symptoms may resolve with or without further involvement from your physician, physical therapist, or athletic trainer. While completing these exercises, remember:   Restoring tissue flexibility helps normal motion to return to the joints. This allows healthier, less  painful movement and activity.  An effective stretch should be held for at least 30 seconds.  A stretch should never be painful. You should only feel a gentle lengthening or release in the stretched tissue. STRETCH - Iliotibial Band  On the floor or bed, lie on your side so your injured leg is on top. Bend your knee and grab your ankle.  Slowly bring your knee back so that your thigh is in line with your trunk. Keep your heel at your buttocks and gently arch your back so your head, shoulders and hips line up.  Slowly lower your leg so that your knee approaches the floor/bed until you feel a gentle stretch on the outside of your thigh. If you do not feel a stretch and your knee will not fall farther, place the heel of your opposite foot on top of your knee and pull your thigh down farther.  Hold this stretch for __________ seconds.  Repeat __________ times. Complete this exercise __________ times per day. STRETCH - Hamstrings, Supine   Lie on your back. Loop a belt or towel over the ball of your foot as shown.  Straighten your knee and slowly pull on the belt to raise your injured leg. Do not allow the knee to bend. Keep your opposite leg flat on the floor.  Raise the leg until you feel a gentle stretch behind your knee or thigh. Hold this position for __________ seconds.  Repeat __________ times. Complete this stretch __________ times per day. STRETCH - Quadriceps, Prone   Lie on your stomach on  a firm surface, such as a bed or padded floor.  Bend your knee and grasp your ankle. If you are unable to reach your ankle or pant leg, use a belt around your foot to lengthen your reach.  Gently pull your heel toward your buttocks. Your knee should not slide out to the side. You should feel a stretch in the front of your thigh and/or knee.  Hold this position for __________ seconds.  Repeat __________ times. Complete this stretch __________ times per day. STRETCHING - Hip Flexors,  Lunge Half kneel with your knee on the floor and your opposite knee bent and directly over your ankle.  Keep good posture with your head over your shoulders. Tighten your buttocks to point your tailbone downward; this will prevent your back from arching too much.  You should feel a gentle stretch in the front of your thigh and/or hip. If you do not feel any resistance, slightly slide your opposite foot forward and then slowly lunge forward so your knee once again lines up over your ankle. Be sure your tailbone remains pointed downward.  Hold this stretch for __________ seconds.  Repeat __________ times. Complete this stretch __________ times per day. STRETCH - Adductors, Lunge  While standing, spread your legs.  Lean away from your injured leg by bending your opposite knee. You may rest your hands on your thigh for balance.  You should feel a stretch in your inner thigh. Hold for __________ seconds.  Repeat __________ times. Complete this exercise __________ times per day.   This information is not intended to replace advice given to you by your health care provider. Make sure you discuss any questions you have with your health care provider.   Document Released: 06/15/2004 Document Revised: 09/22/2014 Document Reviewed: 08/20/2008 Elsevier Interactive Patient Education [employer].

## 2016-03-21 NOTE — Progress Notes (Signed)
Pre visit review using our clinic review tool, if applicable. No additional management support is needed unless otherwise documented below in the visit note. 

## 2016-03-21 NOTE — Progress Notes (Signed)
   Subjective:    Patient ID: Jean Blevins, female    DOB: [birth year], 55 y.o.   MRN: [id number]  HPI The patient is a 55 YO female coming in for left hip and buttock pain. She is having pain for the last 2-3 weeks. She did get back from a trip around that time with some extensive car riding. She denies lower back pain. No injury or fall or overuse. Some pain with lying on that side. She is not sure the onset but notices it more. Sometimes she will having moderate pain all day and other days it is mild most of the day with some worsening. Not predictable when it will hurt. No fevers or chills. No numbness or weakness in her arms or legs.   Review of Systems  Constitutional: Negative.   Respiratory: Negative.   Cardiovascular: Negative.   Gastrointestinal: Negative.   Musculoskeletal: Positive for arthralgias and gait problem. Negative for back pain, joint swelling, myalgias and neck stiffness.      Objective:   Physical Exam  Constitutional: She is oriented to person, place, and time. She appears well-developed and well-nourished.  HENT:  Head: Normocephalic and atraumatic.  Eyes: EOM are normal.  Neck: Normal range of motion.  Cardiovascular: Normal rate and regular rhythm.   Pulmonary/Chest: Effort normal and breath sounds normal.  Abdominal: Soft. She exhibits no distension. There is no tenderness.  Musculoskeletal: She exhibits tenderness.  Pain in the SI joint region and around to the lateral thigh, not much tenderness in the groin.   Neurological: She is alert and oriented to person, place, and time.   Vitals:   03/21/16 1630  BP: 122/82  Pulse: 80  Temp: 98.2 F (36.8 C)  SpO2: 98%  Weight: 184 lb (83.5 kg)      Assessment & Plan:  Flu shot given at visit.

## 2016-03-22 DIAGNOSIS — M25552 Pain in left hip: Secondary | ICD-10-CM | POA: Insufficient documentation

## 2016-03-22 NOTE — Assessment & Plan Note (Signed)
Checking x-ray since pain over the SI joint. Given prednisone burst for possible trochanteric bursitis. Given stretching exercises as well.

## 2016-03-29 ENCOUNTER — Telehealth: Payer: Self-pay | Admitting: Emergency Medicine

## 2016-03-29 NOTE — Telephone Encounter (Signed)
Pt called and pt is still not feeling better. She is still having pain in her hip. She wants to know if you want her to make another appt to be seen or if there is another test you want her to do. Please advise thanks.

## 2016-03-29 NOTE — Telephone Encounter (Signed)
Left message informing patient to call and schedule ov with ortho, or Dr. Tamala Julian.

## 2016-03-29 NOTE — Telephone Encounter (Signed)
Should I schedule her to come back in, or is there anything else that can be done before she comes in for another ov?

## 2016-03-29 NOTE — Telephone Encounter (Signed)
Would recommend her to see sports medicine or orthopedics for the hip pain.

## 2016-04-04 NOTE — Progress Notes (Signed)
[facility] [address], [place] [address] Phone: [phone] Subjective:    I'm seeing this patient by the request  of:  Hoyt Koch, MD   CC: Hip pain.  [id number]  Jean Blevins is a 55 y.o. female coming in with complaint of left hip pain. States that she has had hip and buttocks pain for going on now 4 weeks. Patient states it seems to be exacerbated after a long car ride. Denies any lower back pain. Does not remember any true injury. Continues to have pain when she lays on that side. Patient has tried over-the-counter medications with minimal benefit. States that the pain severity is moderate overall and rates it a 7 out of 10. Mild radiation down the leg but very minimal. Denies any weakness.   patient had x-rays taken 03/22/2016 that were independently visualized by me showing no significant bony normality.  Past Medical History:  Diagnosis Date  . Allergic rhinitis   . Allergy   . Arthritis    knees,back  . Asthma   . Cataract    starting  . Depression   . Palpitations 03/17/2010   Qualifier: Diagnosis of  By: Linda Hedges MD, Heinz Knuckles    Past Surgical History:  Procedure Laterality Date  . ABDOMINAL HYSTERECTOMY  2008   heavy menstrual bleeding  . ANKLE SURGERY    . APPENDECTOMY    . CHOLECYSTECTOMY    . LUMBAR SPINE SURGERY     herniated disc   Social History   Social History  . Marital status: Divorced    Spouse name: N/A  . Number of children: N/A  . Years of education: N/A   Social History Main Topics  . Smoking status: Never Smoker  . Smokeless tobacco: Never Used  . Alcohol use 0.0 oz/week     Comment: very rare   . Drug use: No  . Sexual activity: Not Asked   Other Topics Concern  . None   Social History Narrative   Work or School: medical records      Home Situation: lives with mother      Spiritual Beliefs: beliefs yes, no church      Lifestyle: no regular exercise; diet is so so           No Known Allergies Family History  Problem Relation Age of Onset  . Cancer Mother     breast  . Diabetes Mother   . Heart disease Father     MI in his 64s  . Hypertension Father   . Hyperlipidemia Father   . Diabetes Maternal Grandmother   . Colon cancer Neg Hx   . Colon polyps Neg Hx   . Esophageal cancer Neg Hx   . Rectal cancer Neg Hx   . Stomach cancer Neg Hx     Past medical history, social, surgical and family history all reviewed in electronic medical record.  No pertanent information unless stated regarding to the chief complaint.   Review of Systems:Review of systems updated and as accurate as of 04/05/16  No headache, visual changes, nausea, vomiting, diarrhea, constipation, dizziness, abdominal pain, skin rash, fevers, chills, night sweats, weight loss, swollen lymph nodes, body aches, joint swelling, muscle aches, chest pain, shortness of breath, mood changes.   Objective  Blood pressure 116/78, pulse (!) 101, height 5\' 3"  (1.6 m), weight 185 lb (83.9 kg), SpO2 95 %. Systems examined below as of 04/05/16   General: No apparent distress alert  and oriented x3 mood and affect normal, dressed appropriately.  HEENT: Pupils equal, extraocular movements intact  Respiratory: Patient's speak in full sentences and does not appear short of breath  Cardiovascular: No lower extremity edema, non tender, no erythema  Skin: Warm dry intact with no signs of infection or rash on extremities or on axial skeleton.  Abdomen: Soft nontender  Neuro: Cranial nerves II through XII are intact, neurovascularly intact in all extremities with 2+ DTRs and 2+ pulses.  Lymph: No lymphadenopathy of posterior or anterior cervical chain or axillae bilaterally.  Gait normal with good balance and coordination.  MSK:  Non tender with full range of motion and good stability and symmetric strength and tone of shoulders, elbows, wrist, , knee and ankles bilaterally.  Hip: Left ROM Patient has full  range of motion Strength IR: 5/5, ER: 5/5, Flexion: 5/5, Extension: 5/5, Abduction: 5/5, Adduction: 5/5 Pelvic alignment unremarkable to inspection and palpation. Standing hip rotation and gait without trendelenburg sign / unsteadiness. Greater trochanter without tenderness to palpation. Minimal tenderness with Corky Sox Severe tenderness over the sacroiliac joint on the left side in the paraspinal musculature of the lumbar spine Positive straight leg test   procedure note 97110; 15 minutes spent for Therapeutic exercises as stated in above notes.  This included exercises focusing on stretching, strengthening, with significant focus on eccentric aspects.  Low back exercises that included:  Pelvic tilt/bracing instruction to focus on control of the pelvic girdle and lower abdominal muscles  Glute strengthening exercises, focusing on proper firing of the glutes without engaging the low back muscles Proper stretching techniques for maximum relief for the hamstrings, hip flexors, low back and some rotation where tolerated   Proper technique shown and discussed handout in great detail with ATC.  All questions were discussed and answered.   Impression and Recommendations:     This case required medical decision making of moderate complexity.      Note: This dictation was prepared with Dragon dictation along with smaller phrase technology. Any transcriptional errors that result from this process are unintentional.

## 2016-04-05 ENCOUNTER — Encounter: Payer: Self-pay | Admitting: Family Medicine

## 2016-04-05 ENCOUNTER — Ambulatory Visit (INDEPENDENT_AMBULATORY_CARE_PROVIDER_SITE_OTHER): Payer: 59 | Admitting: Family Medicine

## 2016-04-05 ENCOUNTER — Ambulatory Visit (INDEPENDENT_AMBULATORY_CARE_PROVIDER_SITE_OTHER)
Admission: RE | Admit: 2016-04-05 | Discharge: 2016-04-05 | Disposition: A | Discharge status: Home / Self Care | Payer: 59 | Source: Ambulatory Visit | Attending: Family Medicine | Admitting: Family Medicine

## 2016-04-05 VITALS — BP 116/78 | HR 101 | Ht 63.0 in | Wt 185.0 lb

## 2016-04-05 DIAGNOSIS — M5416 Radiculopathy, lumbar region: Secondary | ICD-10-CM

## 2016-04-05 DIAGNOSIS — E669 Obesity, unspecified: Secondary | ICD-10-CM | POA: Diagnosis not present

## 2016-04-05 MED ORDER — MELOXICAM 15 MG PO TABS: 15.0000 mg | ORAL_TABLET | Freq: Every day | ORAL | 0 refills | Status: AC

## 2016-04-05 MED ORDER — GABAPENTIN 100 MG PO CAPS: 200.0000 mg | ORAL_CAPSULE | Freq: Every day | ORAL | 3 refills | Status: AC

## 2016-04-05 NOTE — Assessment & Plan Note (Signed)
Patient does have more of a lumbar radiculopathy. We discussed with patient at great length. Patient will do more meloxicam as well as gabapentin. X-rays are pending to further evaluate. X-rays last time of the back or in 2008 and patient does have a past medical history significant for back surgery. Discussed the importance of weight loss. We discussed proper shoes, patient will come back again in 2 weeks. Worsening symptoms consider formal physical therapy or MRI. Seek medical attention immediately if any constant numbness or weakness occurs.

## 2016-04-05 NOTE — Patient Instructions (Addendum)
Good to see you.  Ice 20 minutes 2 times daily. Usually after activity and before bed. Exercises 3 times a week.  Meloxicam daily for 10 days then as needed Gabapentin 200mg  at night Lets get an xray downstairs.  See me again in 2 weeks to make sure you are doing better or call me sooner if not.

## 2016-04-05 NOTE — Assessment & Plan Note (Signed)
Encourage weight loss. 

## 2016-04-22 ENCOUNTER — Telehealth: Payer: Self-pay

## 2016-04-22 MED ORDER — MONTELUKAST SODIUM 10 MG PO TABS: ORAL_TABLET | ORAL | 3 refills | Status: AC

## 2016-04-22 NOTE — Telephone Encounter (Signed)
erx for montelukast done.

## 2016-04-27 ENCOUNTER — Ambulatory Visit: Payer: 59 | Admitting: Family Medicine

## 2016-05-08 ENCOUNTER — Ambulatory Visit: Payer: 59 | Admitting: Family Medicine

## 2016-06-29 ENCOUNTER — Ambulatory Visit (INDEPENDENT_AMBULATORY_CARE_PROVIDER_SITE_OTHER): Payer: Self-pay | Admitting: Internal Medicine

## 2016-06-29 ENCOUNTER — Encounter: Payer: Self-pay | Admitting: Internal Medicine

## 2016-06-29 DIAGNOSIS — J4521 Mild intermittent asthma with (acute) exacerbation: Secondary | ICD-10-CM

## 2016-06-29 MED ORDER — PREDNISONE 20 MG PO TABS: 40.0000 mg | ORAL_TABLET | Freq: Every day | ORAL | 0 refills | Status: AC

## 2016-06-29 MED ORDER — DOXYCYCLINE HYCLATE 100 MG PO TABS: 100.0000 mg | ORAL_TABLET | Freq: Two times a day (BID) | ORAL | 0 refills | Status: AC

## 2016-06-29 NOTE — Progress Notes (Signed)
   Subjective:    Patient ID: Jean Blevins, female    DOB: [birth year], 56 y.o.   MRN: [id number]  HPI The patient is a 56 YO female coming in for cough and congestion for about 1 week. She does have asthma and is using her albuterol inhaler more. She is feeling like her chest is getting tight. She is not improving and feels like this is worsening. She is taking otc meds and not helping at all. Some sick contacts.   Review of Systems  Constitutional: Positive for activity change, chills and fatigue. Negative for appetite change, fever and unexpected weight change.  HENT: Positive for congestion, rhinorrhea, sinus pain and sinus pressure. Negative for ear discharge, ear pain, nosebleeds, postnasal drip, sore throat and trouble swallowing.   Eyes: Negative.   Respiratory: Positive for cough, chest tightness, shortness of breath and wheezing.   Cardiovascular: Negative.   Gastrointestinal: Negative.   Musculoskeletal: Negative.       Objective:   Physical Exam  Constitutional: She is oriented to person, place, and time. She appears well-developed and well-nourished.  HENT:  Head: Normocephalic and atraumatic.  Oropharynx with redness and clear drainage, nose without crusting  Eyes: EOM are normal.  Neck: Normal range of motion.  Cardiovascular: Normal rate and regular rhythm.   Pulmonary/Chest: Effort normal. No respiratory distress. She has wheezes. She has no rales. She exhibits no tenderness.  Some wheezing and rhonchi which do not clear with cough.  Abdominal: Soft.  Neurological: She is alert and oriented to person, place, and time.  Skin: Skin is warm and dry.   Vitals:   06/29/16 0950  BP: 118/70  Pulse: 81  Temp: 99.4 F (37.4 C)  TempSrc: Oral  SpO2: 98%  Weight: 184 lb (83.5 kg)  Height: 5\' 3"  (1.6 m)      Assessment & Plan:

## 2016-06-29 NOTE — Assessment & Plan Note (Signed)
With exacerbation today, rx for prednisone and doxycycline to help symptoms and she has albuterol inhaler prn. If no improvement will call back.

## 2016-06-29 NOTE — Patient Instructions (Signed)
We have sent in doxycycline which is the antibiotic. Take 1 pill twice a day for 1 week.  We have also sent in the prednisone take 2 pills a day until gone.

## 2016-06-29 NOTE — Progress Notes (Signed)
Pre visit review using our clinic review tool, if applicable. No additional management support is needed unless otherwise documented below in the visit note. 

## 2016-07-10 IMAGING — US US ABDOMEN COMPLETE
1 series · 13 of 25 positions shown · non-contrast
Comparison: None.

CLINICAL DATA: Elevated liver function studies as well as lipase,
left upper quadrant pain with nausea and vomiting for the past 5
days

EXAM:
ULTRASOUND ABDOMEN COMPLETE

[Series 1: us abdomen complete · 0.35mm/px · 13 of 72 slices shown]
[im 1/72]
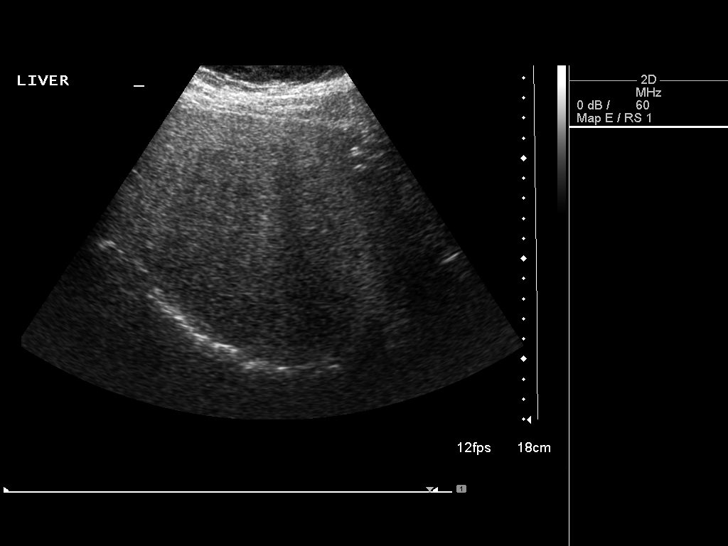
[im 6/72]
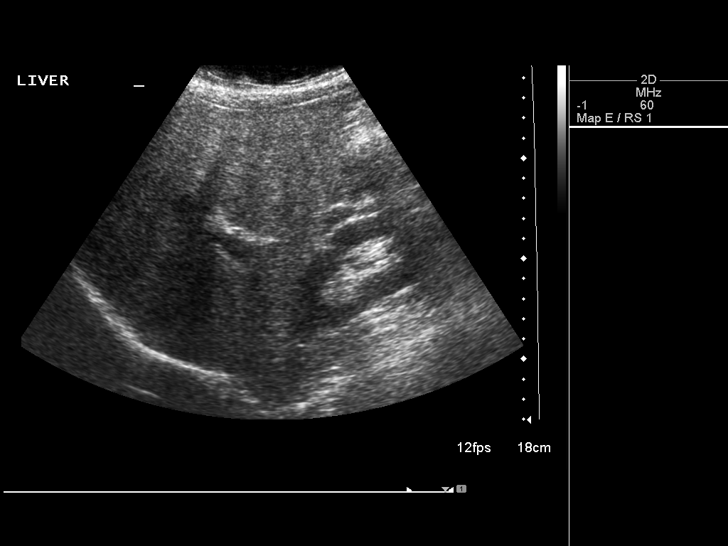
[im 12/72]
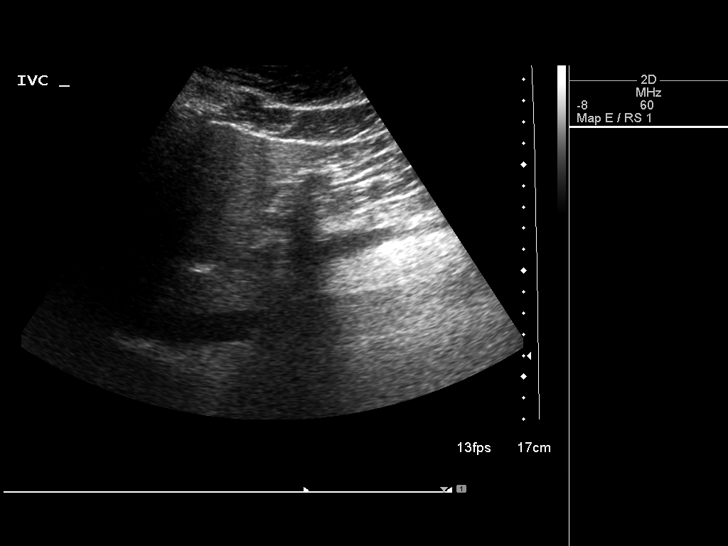
[im 18/72]
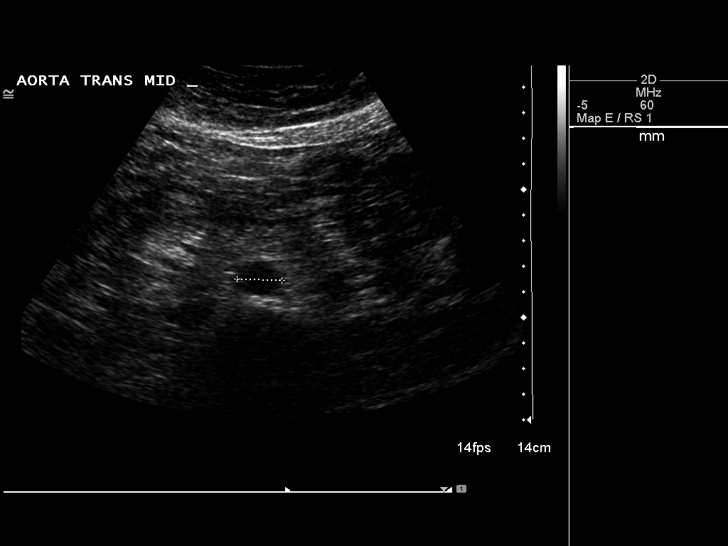
[im 24/72]
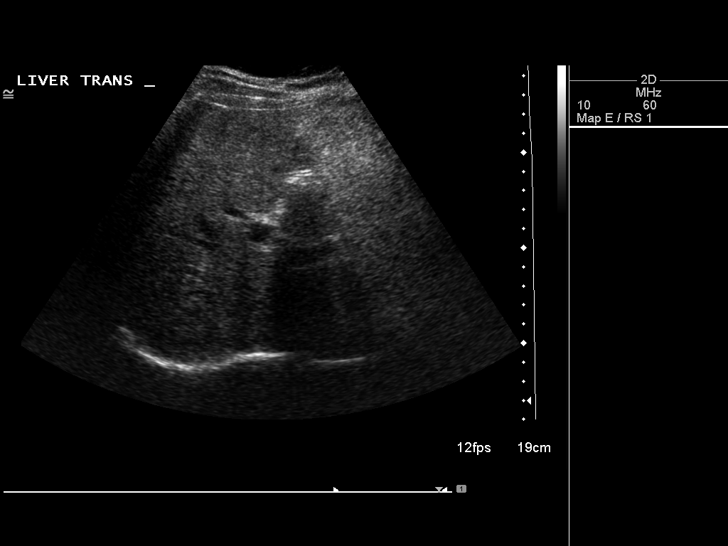
[im 30/72]
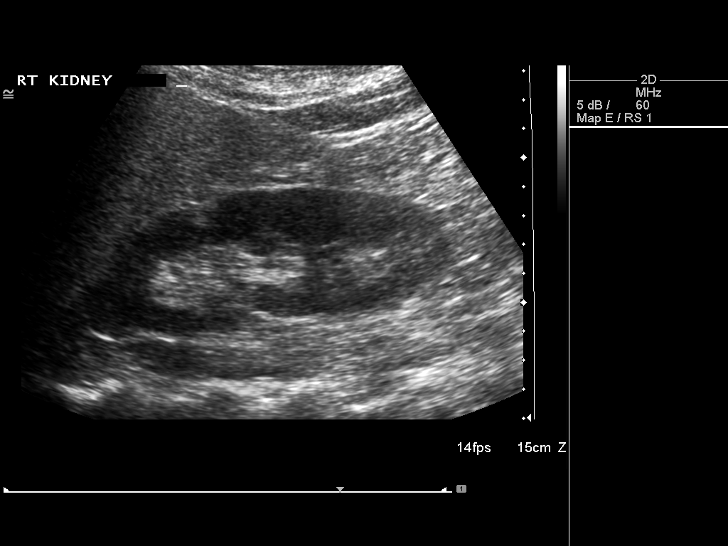
[im 36/72]
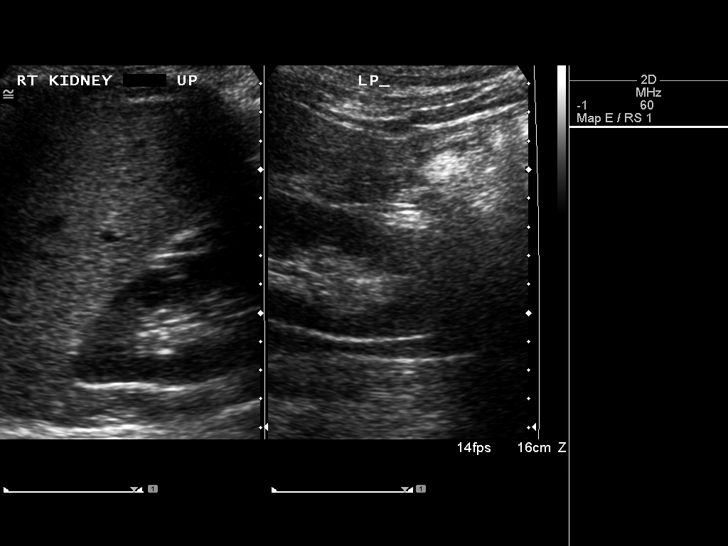
[im 42/72]
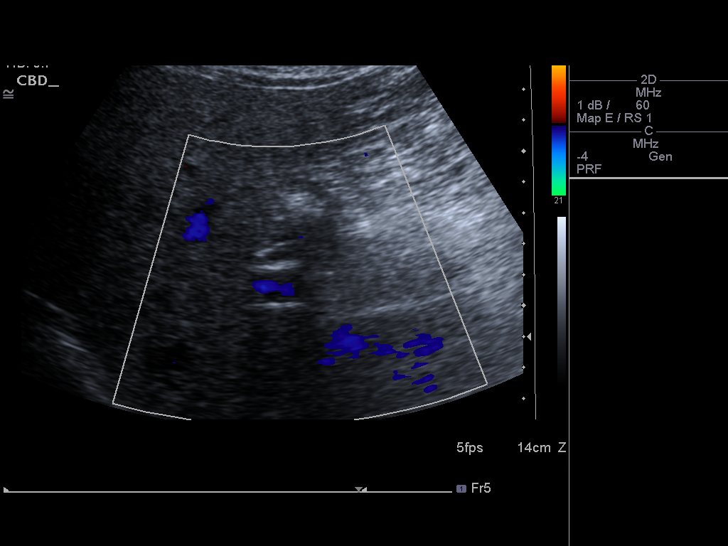
[im 48/72]
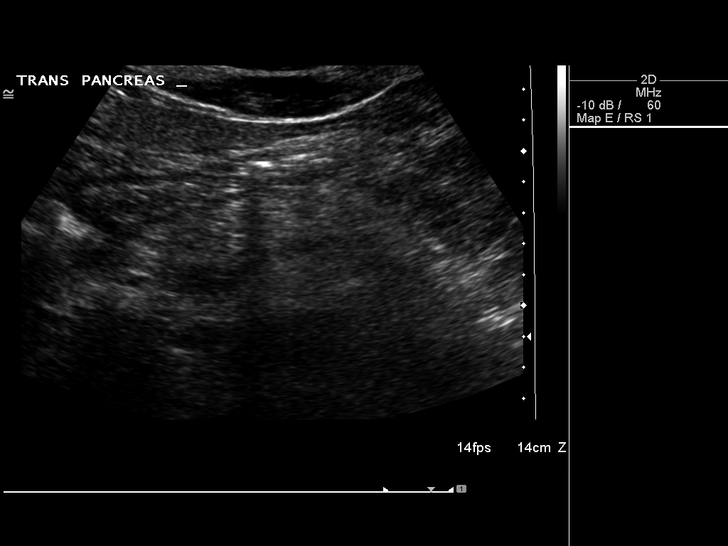
[im 54/72]
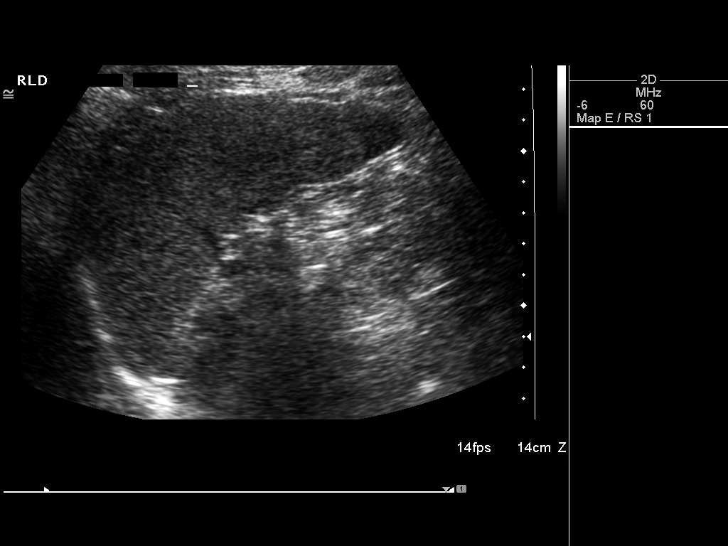
[im 60/72]
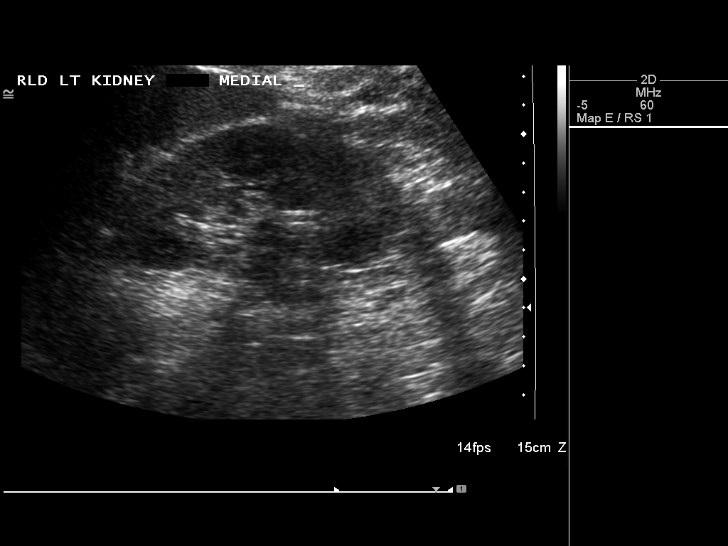
[im 66/72]
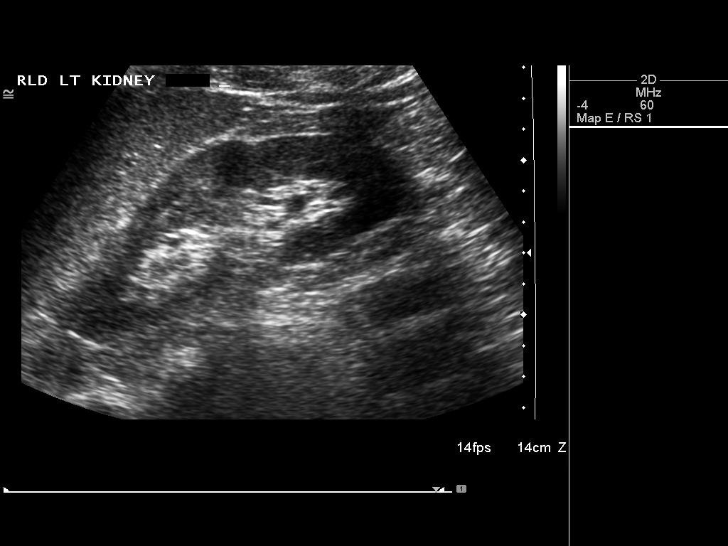
[im 72/72]
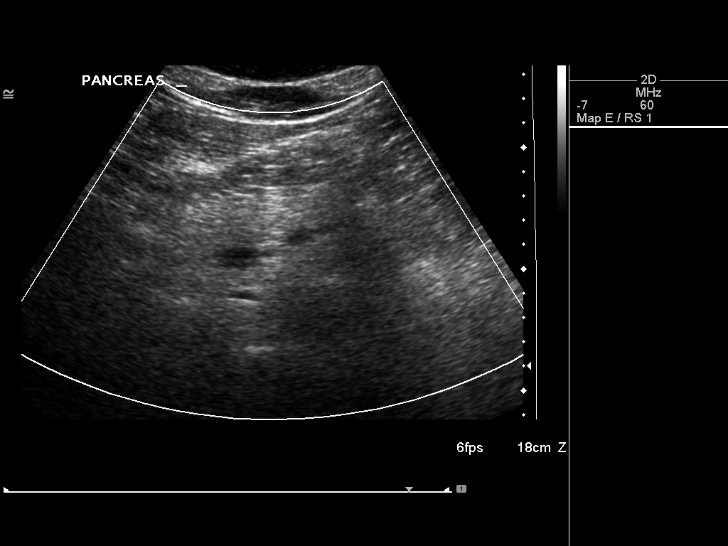

[13 of 25 positions shown; findings below may reference images not displayed]

FINDINGS: Gallbladder: The gallbladder is surgically absent. The gallbladder
fossa is unremarkable.

Common bile duct: Diameter: 4.2 mm

Liver: The hepatic echotexture is heterogeneous and increased. There
is no focal mass or ductal dilation.

IVC: No abnormality visualized.

Pancreas: Bowel gas degrades the images of the pancreas. No definite
pancreatic mass, ductal dilation, or surrounding fluid collections
are demonstrated.

Spleen: Size and appearance within normal limits.

Right Kidney: Length: 13.4 cm. Echogenicity within normal limits. No
mass or hydronephrosis visualized.

Left Kidney: Length: 12.5 cm. Echogenicity within normal limits. No
mass or hydronephrosis visualized.

Abdominal aorta: No aneurysm visualized.

Other findings: No ascites is demonstrated.
IMPRESSION: 1. Fatty infiltrative change of the liver. No acute hepatic
abnormality is demonstrated.
2. The pancreas where visualized is unremarkable. Given the
patient's symptoms and laboratory values, abdominal CT scanning with
attention to the pancreas would be useful.

## 2018-02-25 IMAGING — DX DG HIP (WITH OR WITHOUT PELVIS) 2-3V*L*
2 series · 2 of 2 positions shown · non-contrast
Comparison: Lumbar spine series which included a portion of the
left hip dated May 04, 2006.

CLINICAL DATA: [DATE] week history of left hip pain without known
injury

EXAM:
DG HIP (WITH OR WITHOUT PELVIS) 2-3V LEFT

[hip ap]
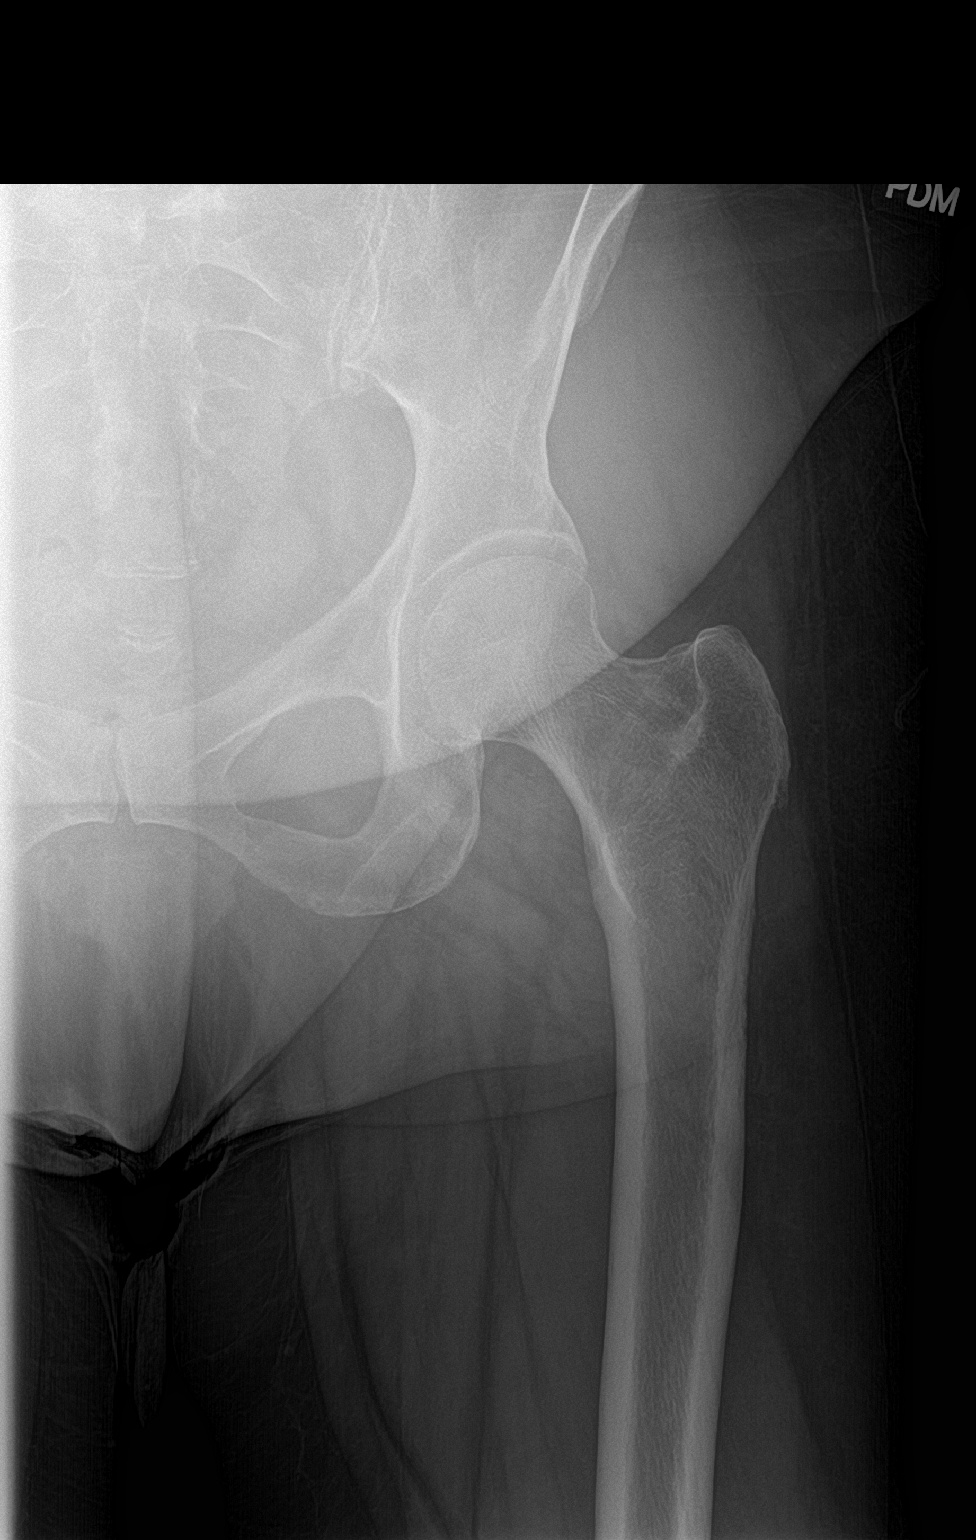

[hip lat]
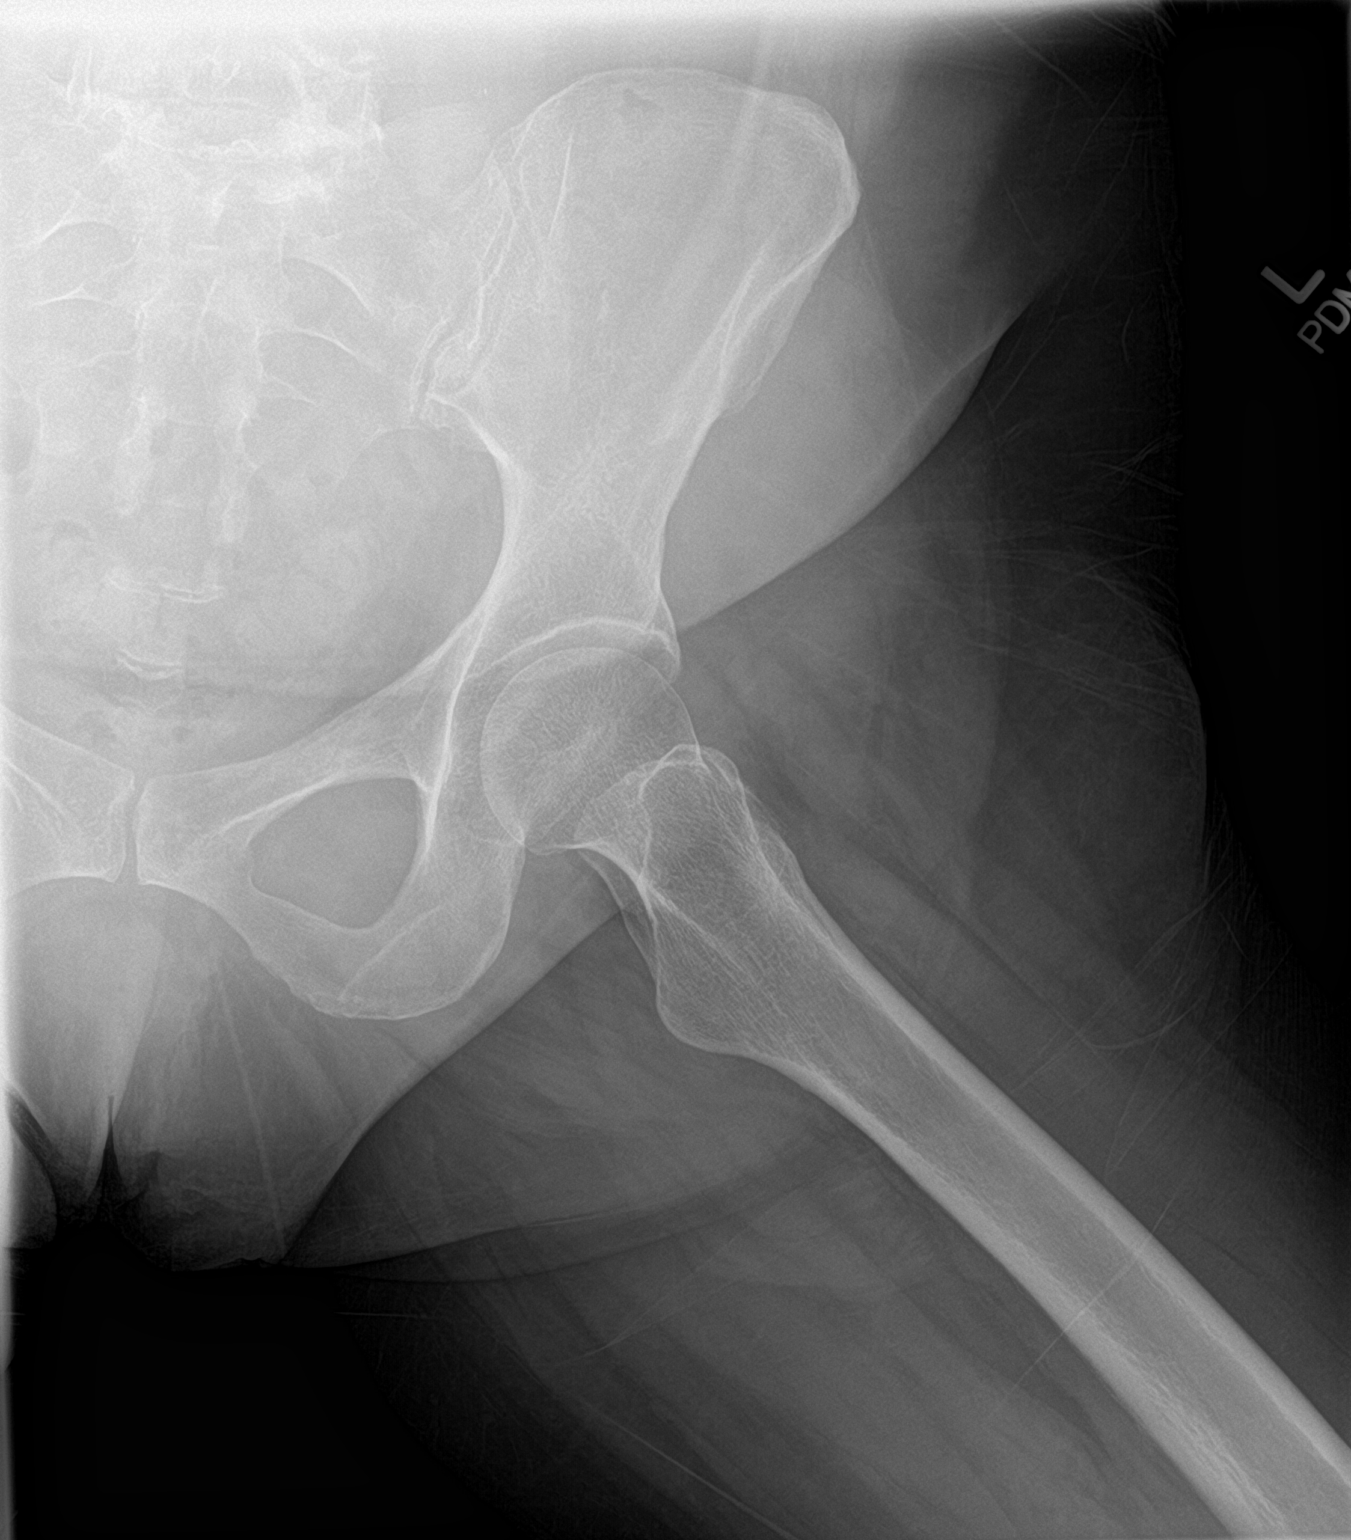

[2 of 2 positions shown; findings below may reference images not displayed]

FINDINGS: The bones are subjectively adequately mineralized. There is no lytic
or blastic lesion. AP and lateral views of the left hip reveal
preservation of the joint space. The articular surfaces of the
femoral head and acetabulum remains smoothly rounded. The femoral
neck, intertrochanteric, and subtrochanteric regions are normal.
IMPRESSION: There is no acute or significant chronic bony abnormality of the
left hip.

## 2018-12-23 ENCOUNTER — Encounter: Payer: Self-pay | Admitting: Internal Medicine
